# Patient Record
Sex: Female | Born: 1935 | Race: White | Hispanic: No | Marital: Married | Smoking: Never smoker
Health system: Southern US, Community
[De-identification: ages and names within clinical notes are randomized; demographics above are authoritative.]

## PROBLEM LIST (undated history)

## (undated) DIAGNOSIS — I639 Cerebral infarction, unspecified: Secondary | ICD-10-CM

## (undated) DIAGNOSIS — I10 Essential (primary) hypertension: Secondary | ICD-10-CM

## (undated) DIAGNOSIS — I4891 Unspecified atrial fibrillation: Secondary | ICD-10-CM

## (undated) DIAGNOSIS — I509 Heart failure, unspecified: Secondary | ICD-10-CM

## (undated) HISTORY — PX: FEMORAL ARTERY STENT: SHX1583

## (undated) HISTORY — DX: Cerebral infarction, unspecified: I63.9

## (undated) HISTORY — DX: Unspecified atrial fibrillation: I48.91

## (undated) HISTORY — DX: Essential (primary) hypertension: I10

## (undated) HISTORY — PX: OTHER SURGICAL HISTORY: SHX169

## (undated) HISTORY — DX: Heart failure, unspecified: I50.9

---

## 2011-09-21 ENCOUNTER — Ambulatory Visit (INDEPENDENT_AMBULATORY_CARE_PROVIDER_SITE_OTHER): Payer: Medicare Other | Admitting: Family Medicine

## 2011-09-21 VITALS — BP 137/68 | HR 73 | Temp 98.1°F | Wt 123.0 lb

## 2011-09-21 DIAGNOSIS — G8929 Other chronic pain: Secondary | ICD-10-CM

## 2011-09-21 DIAGNOSIS — M549 Dorsalgia, unspecified: Secondary | ICD-10-CM

## 2011-09-21 DIAGNOSIS — I10 Essential (primary) hypertension: Secondary | ICD-10-CM

## 2011-09-21 DIAGNOSIS — I639 Cerebral infarction, unspecified: Secondary | ICD-10-CM

## 2011-09-21 DIAGNOSIS — R0989 Other specified symptoms and signs involving the circulatory and respiratory systems: Secondary | ICD-10-CM

## 2011-09-21 DIAGNOSIS — I6350 Cerebral infarction due to unspecified occlusion or stenosis of unspecified cerebral artery: Secondary | ICD-10-CM

## 2011-09-21 DIAGNOSIS — E876 Hypokalemia: Secondary | ICD-10-CM

## 2011-09-21 HISTORY — DX: Cerebral infarction, unspecified: I63.9

## 2011-09-21 HISTORY — DX: Essential (primary) hypertension: I10

## 2011-09-21 NOTE — Progress Notes (Signed)
Subjective:    Patient ID: Brooke Cunningham, female    DOB: [birth year], 78 y.o.   MRN: [id number]  [facility] She is here today as a new patient to establish care. She currently takes medications for high blood pressure and cholesterol. She does have a history of a CVA approximately 2-3 years ago. She says at that time she was started on all of her current medications. She says none of them have been changed since then.  No CP or SOB.  No problems getting her medication.  No SE onb the medication. BP is well controlled at home.  Happy with current regimen. She does want me ot look over her meds to make sure she really needs them. She is supposed to take a daily aspirin but says often she forgets. Her husband is also a patient here.  She says her pneumon, flu and tetanus vaccines are up to date with previous PCP.    Review of Systems  Constitutional: Negative for fever, diaphoresis and unexpected weight change.  HENT: Negative for hearing loss, rhinorrhea and tinnitus.   Eyes: Negative for visual disturbance.  Respiratory: Negative for cough and wheezing.   Cardiovascular: Negative for chest pain and palpitations.  Gastrointestinal: Negative for nausea, vomiting, diarrhea and blood in stool.  Genitourinary: Negative for vaginal bleeding, vaginal discharge and difficulty urinating.  Musculoskeletal: Negative for myalgias and arthralgias.  Skin: Negative for rash.  Neurological: Negative for headaches.  Hematological: Negative for adenopathy. Does not bruise/bleed easily.  Psychiatric/Behavioral: Negative for sleep disturbance and dysphoric mood. The patient is not nervous/anxious.         BP 137/68  Pulse 73  Temp(Src) 98.1 F (36.7 C) (Oral)  Wt 123 lb (55.792 kg)  SpO2 97%    No Known Allergies  No past medical history on file.  No past surgical history on file.  History   Social History  . Marital Status: Married    Spouse Name: Michelle Piper    Number of Children: N/A  . Years of  Education: N/A   Occupational History  . Not on file.   Social History Main Topics  . Smoking status: Never Smoker   . Smokeless tobacco: Not on file  . Alcohol Use: No  . Drug Use: No  . Sexually Active: Not Currently   Other Topics Concern  . Not on file   Social History Narrative   2 caffeine drinks per day.  Walks for exercise.     Family History  Problem Relation Age of Onset  . Pancreatic cancer    . Hyperlipidemia Sister   . Hypertension Mother   . Stroke Mother   . Stroke Sister     @[email or web address]    Objective:   Physical Exam  Constitutional: She is oriented to person, place, and time. She appears well-developed and well-nourished.  HENT:  Head: Normocephalic and atraumatic.  Cardiovascular: Normal rate, regular rhythm and normal heart sounds.   Pulmonary/Chest: Effort normal and breath sounds normal.  Neurological: She is alert and oriented to person, place, and time.  Skin: Skin is warm and dry.  Psychiatric: She has a normal mood and affect. Her behavior is normal.          Assessment & Plan:  HTN - doing well. Check BMP today. Last lipid was normal in July. Since her blood pressure is well-controlled and assuming that her blood work is normal then I'll ask her to return in 6 months for routine followup. But, should certainly have her up  to date medical records and we can make sure she's not due for any specific preventative care measures.  CVA- I reviewed with her the importance of controlling her blood pressure, cholesterol and taking her daily aspirin to help reduce her risk of future stroke.

## 2011-09-22 ENCOUNTER — Encounter: Payer: Self-pay | Admitting: Family Medicine

## 2011-09-22 LAB — BASIC METABOLIC PANEL WITH GFR
CO2: 24 mEq/L (ref 19–32)
Chloride: 99 mEq/L (ref 96–112)
Creat: 1.26 mg/dL — ABNORMAL HIGH (ref 0.50–1.10)
Glucose, Bld: 116 mg/dL — ABNORMAL HIGH (ref 70–99)
Sodium: 137 mEq/L (ref 135–145)

## 2011-12-01 ENCOUNTER — Encounter: Payer: Self-pay | Admitting: Physician Assistant

## 2011-12-01 ENCOUNTER — Ambulatory Visit (INDEPENDENT_AMBULATORY_CARE_PROVIDER_SITE_OTHER): Payer: Medicare Other | Admitting: Physician Assistant

## 2011-12-01 VITALS — BP 133/65 | HR 64 | Temp 98.9°F | Wt 124.0 lb

## 2011-12-01 DIAGNOSIS — S139XXA Sprain of joints and ligaments of unspecified parts of neck, initial encounter: Secondary | ICD-10-CM

## 2011-12-01 MED ORDER — CYCLOBENZAPRINE HCL 5 MG PO TABS: 5.0000 mg | ORAL_TABLET | Freq: Every evening | ORAL | Status: DC | PRN

## 2011-12-01 NOTE — Progress Notes (Signed)
  Subjective:    Patient ID: Brooke Cunningham, female    DOB: Jul 26, 1929, 78 y.o.   MRN: [id number]  HPI Patient presents to clinic with neck pain starting this morning. Patient did a lot of knitting yesterday but other than that he does not remember any trauma. She had problems getting comfortable last night. It is hard for her to turn her head both ways but worse on the left. She denies any headache. She has not had this before. She has not tried anything except heating pads to make better and that has helped. She denies any hand numbness or pain.     Review of Systems     Objective:   Physical Exam  Constitutional: She is oriented to person, place, and time. She appears well-developed and well-nourished.  HENT:  Head: Normocephalic and atraumatic.  Cardiovascular: Normal rate, regular rhythm and normal heart sounds.   Pulmonary/Chest: Effort normal and breath sounds normal. She has no wheezes.  Musculoskeletal:       ROM of neck is limited with flexion, extentsion, and side to side with left being less ROM than right. Strength of neck 5/5. No bony tenderness of the cervical spine. Trapizues muscle is very tight.  Neurological: She is alert and oriented to person, place, and time.          Assessment & Plan:  Cervical neck sprain- Advil 2 of 200mg  tabs three times a days. Start doing neck streches up and down, side to side. Will get give Flexeril 5mg  only when going to bed or going to lay down for a nap.Take frequent breaks when knitting to stretch neck. Continue to use ice on neck. Call if not improving. Gave handout on neck sprain.

## 2011-12-01 NOTE — Patient Instructions (Addendum)
Advil 2 of 200mg  tabs three times a days. Start doing neck streches up and down, side to side. Will get give Flexeril 5mg  only when going to bed or going to lay down for a nap.Take frequent breaks when knitting to stretch neck. Continue to use ice on neck. Call if not improving.   Cervical Sprain A cervical sprain is an injury in the neck in which the ligaments are stretched or torn. The ligaments are the tissues that hold the bones of the neck (vertebrae) in place.Cervical sprains can range from very mild to very severe. Most cervical sprains get better in 1 to 3 weeks, but it depends on the cause and extent of the injury. Severe cervical sprains can cause the neck vertebrae to be unstable. This can lead to damage of the spinal cord and can result in serious nervous system problems. Your caregiver will determine whether your cervical sprain is mild or severe. CAUSES  Severe cervical sprains may be caused by:  Contact sport injuries (football, rugby, wrestling, hockey, auto racing, gymnastics, diving, martial arts, boxing).   Motor vehicle collisions.   Whiplash injuries. This means the neck is forcefully whipped backward and forward.   Falls.  Mild cervical sprains may be caused by:   Awkward positions, such as cradling a telephone between your ear and shoulder.   Sitting in a chair that does not offer proper support.   Working at a poorly Marketing executive station.   Activities that require looking up or down for long periods of time.  SYMPTOMS   Pain, soreness, stiffness, or a burning sensation in the front, back, or sides of the neck. This discomfort may develop immediately after injury or it may develop slowly and not begin for 24 hours or more after an injury.   Pain or tenderness directly in the middle of the back of the neck.   Shoulder or upper back pain.   Limited ability to move the neck.   Headache.   Dizziness.   Weakness, numbness, or tingling in the hands or arms.    Muscle spasms.   Difficulty swallowing or chewing.   Tenderness and swelling of the neck.  DIAGNOSIS  Most of the time, your caregiver can diagnose this problem by taking your history and doing a physical exam. Your caregiver will ask about any known problems, such as arthritis in the neck or a previous neck injury. X-rays may be taken to find out if there are any other problems, such as problems with the bones of the neck. However, an X-ray often does not reveal the full extent of a cervical sprain. Other tests such as a computed tomography (CT) scan or magnetic resonance imaging (MRI) may be needed. TREATMENT  Treatment depends on the severity of the cervical sprain. Mild sprains can be treated with rest, keeping the neck in place (immobilization), and pain medicines. Severe cervical sprains need immediate immobilization and an appointment with an orthopedist or neurosurgeon. Several treatment options are available to help with pain, muscle spasms, and other symptoms. Your caregiver may prescribe:  Medicines, such as pain relievers, numbing medicines, or muscle relaxants.   Physical therapy. This can include stretching exercises, strengthening exercises, and posture training. Exercises and improved posture can help stabilize the neck, strengthen muscles, and help stop symptoms from returning.   A neck collar to be worn for short periods of time. Often, these collars are worn for comfort. However, certain collars may be worn to protect the neck and prevent further  worsening of a serious cervical sprain.  HOME CARE INSTRUCTIONS   Put ice on the injured area.   Put ice in a plastic bag.   Place a towel between your skin and the bag.   Leave the ice on for 15 to 20 minutes, 3 to 4 times a day.   Only take over-the-counter or prescription medicines for pain, discomfort, or fever as directed by your caregiver.   Keep all follow-up appointments as directed by your caregiver.   Keep all  physical therapy appointments as directed by your caregiver.   If a neck collar is prescribed, wear it as directed by your caregiver.   Do not drive while wearing a neck collar.   Make any needed adjustments to your work station to promote good posture.   Avoid positions and activities that make your symptoms worse.   Warm up and stretch before being active to help prevent problems.  SEEK MEDICAL CARE IF:   Your pain is not controlled with medicine.   You are unable to decrease your pain medicine over time as planned.   Your activity level is not improving as expected.  SEEK IMMEDIATE MEDICAL CARE IF:   You develop any bleeding, stomach upset, or signs of an allergic reaction to your medicine.   Your symptoms get worse.   You develop new, unexplained symptoms.   You have numbness, tingling, weakness, or paralysis in any part of your body.  MAKE SURE YOU:   Understand these instructions.   Will watch your condition.   Will get help right away if you are not doing well or get worse.  Document Released: 06/06/2007 Document Revised: 07/29/2011 Document Reviewed: 05/12/2011 [facility] Patient Information 2012 [place], [place].

## 2012-02-08 ENCOUNTER — Telehealth: Payer: Self-pay | Admitting: *Deleted

## 2012-02-08 NOTE — Telephone Encounter (Signed)
Husband informed

## 2012-02-08 NOTE — Telephone Encounter (Signed)
She needs appt.  See if can see Jade next week.

## 2012-02-08 NOTE — Telephone Encounter (Signed)
Husband came in stating hasn't been on her depression med and states that she is not sleeping and needs to go back on it. States she is having mood swings and states she is not remembering as well either. States if you need to see her he can bring her. Please advise.

## 2012-02-16 ENCOUNTER — Ambulatory Visit (INDEPENDENT_AMBULATORY_CARE_PROVIDER_SITE_OTHER): Payer: Medicare Other | Admitting: Physician Assistant

## 2012-02-16 ENCOUNTER — Encounter: Payer: Self-pay | Admitting: Physician Assistant

## 2012-02-16 VITALS — BP 125/63 | HR 69 | Wt 124.0 lb

## 2012-02-16 DIAGNOSIS — E039 Hypothyroidism, unspecified: Secondary | ICD-10-CM

## 2012-02-16 DIAGNOSIS — R413 Other amnesia: Secondary | ICD-10-CM

## 2012-02-16 DIAGNOSIS — F329 Major depressive disorder, single episode, unspecified: Secondary | ICD-10-CM

## 2012-02-16 DIAGNOSIS — F3289 Other specified depressive episodes: Secondary | ICD-10-CM

## 2012-02-16 DIAGNOSIS — G4700 Insomnia, unspecified: Secondary | ICD-10-CM

## 2012-02-16 DIAGNOSIS — E785 Hyperlipidemia, unspecified: Secondary | ICD-10-CM

## 2012-02-16 MED ORDER — SERTRALINE HCL 50 MG PO TABS: 50.0000 mg | ORAL_TABLET | Freq: Every day | ORAL | Status: DC

## 2012-02-16 NOTE — Patient Instructions (Addendum)
Start Zoloft at night before bed. Follow up in 4-6 weeks.

## 2012-02-16 NOTE — Progress Notes (Signed)
  Subjective:    Patient ID: Brooke Cunningham, female    DOB: 03-07-1929, 78 y.o.   MRN: [id number]  HPI Patient presents to the clinic to restart depression medication. She cannot remember today what she was started on in the past. I also cannot locate in the epic system. She recent got out of hospital for stent placement in her right groin. She was told she had had heart attack. She follows up with Dr. Chales Abrahams in July for stent. She was told to stay on plavix and ASA.  With everything going on she feels really down. She doesn't want to do anything and just sad that she is aging. She used to love to knit but lately she doesn't even want to do that. She is having a lot of problems sleeping. She lays in bed for hours trying to go to sleep. She has hypothyroidism but her med is not listed she reports to be on medication. TSH has not been recheck in a while. Her husband is with her and states she has had some memory loss. That she can't remember things the doctor says or that he tells her. Pt is not as much concerned. Denies any harmful thoughts of hurting herself.   Review of Systems     Objective:   Physical Exam  Constitutional: She is oriented to person, place, and time. She appears well-developed and well-nourished.  HENT:  Head: Normocephalic and atraumatic.  Cardiovascular: Normal rate, regular rhythm and normal heart sounds.   Pulmonary/Chest: Effort normal and breath sounds normal. She has no wheezes.  Neurological: She is alert and oriented to person, place, and time.  Skin: Skin is warm and dry.  Psychiatric:       Flat affect. She sat looking straight ahead with not much emotion.          Assessment & Plan:  Depression/insomnia- PHQ-9 was 14(moderate depression). GAD-7 was 6. Zoloft was given for pt to start 1/2 tab at night for 7 days then increase to 1 tab. I thought about trying celexa but with the recent heart issues wanted to stay away. Discussed side effects and potential  worsening depression. Side effects should go away in first 2 weeks but if worsens depression call office and we will stop medication. Follow up in 4-6 weeks.   Memory changes/hypothyroidism- Will check TSH to see if it could be contributing to memory changes. Hx of hypothyroidism but pt has not reported med. Will bring to next appt. Depression could also be effecting her memory. Will check B12 also.   Hyperlipidemia- Will check fasting cholesterol and make sure changes don't need to made.

## 2012-02-22 ENCOUNTER — Ambulatory Visit (INDEPENDENT_AMBULATORY_CARE_PROVIDER_SITE_OTHER): Payer: Medicare Other | Admitting: Family Medicine

## 2012-02-22 ENCOUNTER — Encounter: Payer: Self-pay | Admitting: Family Medicine

## 2012-02-22 VITALS — BP 111/62 | HR 62 | Temp 97.5°F | Wt 123.0 lb

## 2012-02-22 DIAGNOSIS — E871 Hypo-osmolality and hyponatremia: Secondary | ICD-10-CM

## 2012-02-22 DIAGNOSIS — I2510 Atherosclerotic heart disease of native coronary artery without angina pectoris: Secondary | ICD-10-CM

## 2012-02-22 DIAGNOSIS — R5383 Other fatigue: Secondary | ICD-10-CM

## 2012-02-22 LAB — CBC WITH DIFFERENTIAL/PLATELET
Basophils Relative: 1 % (ref 0–1)
Hemoglobin: 12.2 g/dL (ref 12.0–15.0)
Lymphs Abs: 1.3 10*3/uL (ref 0.7–4.0)
Monocytes Relative: 12 % (ref 3–12)
Neutro Abs: 3.8 10*3/uL (ref 1.7–7.7)
Neutrophils Relative %: 64 % (ref 43–77)
RBC: 3.93 MIL/uL (ref 3.87–5.11)
WBC: 5.9 10*3/uL (ref 4.0–10.5)

## 2012-02-22 LAB — COMPLETE METABOLIC PANEL WITH GFR
ALT: 11 U/L (ref 0–35)
Albumin: 4.3 g/dL (ref 3.5–5.2)
Alkaline Phosphatase: 58 U/L (ref 39–117)
CO2: 27 mEq/L (ref 19–32)
GFR, Est Non African American: 67 mL/min
Glucose, Bld: 101 mg/dL — ABNORMAL HIGH (ref 70–99)
Potassium: 3.9 mEq/L (ref 3.5–5.3)
Sodium: 130 mEq/L — ABNORMAL LOW (ref 135–145)
Total Protein: 7.1 g/dL (ref 6.0–8.3)

## 2012-02-22 NOTE — Progress Notes (Addendum)
Subjective:    Patient ID: Brooke Cunningham, female    DOB: 1928-08-29, 78 y.o.   MRN: [id number]  HPI Recnelty in the hospital. She was diagnosed with an MI and had a cardiac stent placed. Says wasn't sure what was going on. She was give trazodone to help her sleep. Feels weak and nervous and jittery in her chest.  REcently started sertraline.  They did do lab work and diagnosed her with mild hyponatremia.  We called several time to get medical records. Will addend once get records.    Review of Systems  BP 111/62  Pulse 62  Temp 97.5 F (36.4 C) (Oral)  Wt 123 lb (55.792 kg)  SpO2 100%    No Known Allergies  No past medical history on file.  Past Surgical History  Procedure Date  . Carotid stent     left  . Femoral artery stent     right    History   Social History  . Marital Status: Married    Spouse Name: Brooke Cunningham    Number of Children: N/A  . Years of Education: N/A   Occupational History  . Not on file.   Social History Main Topics  . Smoking status: Never Smoker   . Smokeless tobacco: Not on file  . Alcohol Use: No  . Drug Use: No  . Sexually Active: Not Currently   Other Topics Concern  . Not on file   Social History Narrative   2 caffeine drinks per day.  Walks for exercise.     Family History  Problem Relation Age of Onset  . Pancreatic cancer    . Hyperlipidemia Sister   . Hypertension Mother   . Stroke Mother   . Stroke Sister     Outpatient Encounter Prescriptions as of 02/22/2012  Medication Sig Dispense Refill  . amLODipine (NORVASC) 10 MG tablet Take 10 mg by mouth daily.      Marland Kitchen aspirin 81 MG tablet Take 160 mg by mouth daily.      . clopidogrel (PLAVIX) 75 MG tablet Take 75 mg by mouth daily.      . cyclobenzaprine (FLEXERIL) 5 MG tablet Take 1 tablet (5 mg total) by mouth at bedtime as needed for muscle spasms. Take 1/2 pill to start with at night before bed.  20 tablet  0  . levothyroxine (SYNTHROID, LEVOTHROID) 88 MCG tablet Take 88  mcg by mouth daily.      Marland Kitchen losartan (COZAAR) 50 MG tablet Take 50 mg by mouth daily.      Marland Kitchen lovastatin (MEVACOR) 40 MG tablet Take 40 mg by mouth every other day.      . metoprolol tartrate (LOPRESSOR) 25 MG tablet Take 25 mg by mouth 2 (two) times daily.      . potassium chloride (MICRO-K) 10 MEQ CR capsule Take 10 mEq by mouth daily.      . sertraline (ZOLOFT) 50 MG tablet Take 1 tablet (50 mg total) by mouth daily. Take 1/2 tab for first 7 days then increase to 1 tab.  30 tablet  1  . traZODone (DESYREL) 50 MG tablet Take 50 mg by mouth at bedtime.              Objective:   Physical Exam  Constitutional: She is oriented to person, place, and time. She appears well-developed and well-nourished.  HENT:  Head: Normocephalic and atraumatic.  Cardiovascular: Normal rate, regular rhythm and normal heart sounds.  No carotid or abdominal bruits.   Pulmonary/Chest: Effort normal and breath sounds normal.  Musculoskeletal: She exhibits no edema.  Neurological: She is alert and oriented to person, place, and time.  Skin: Skin is warm and dry.  Psychiatric: She has a normal mood and affect. Her behavior is normal.          Assessment & Plan:  Insomnia - Can add tylenol pm to her trazodone.  She can also increase the trazodone to 2 tablets if needed. Move sertraline to the AM.  The sertraline could certainly be disturbing her sleep as well. She is also very anxious to consider adding a benzodiazepine very short-term at a very low dose if needed.  Hyponatremia-recheck lab work today.  Fatigue -this is most likely from having the stent placed. Explained her that sometimes it can take a couple weeks to recover from the procedure like this especially at her age. I also check her electrolytes to make sure they're normal and recheck a CBC to make sure that she has not had any drop in her hemoglobin since her procedure.  Addendum I finally received her discharge summary on 02/25/2012. She  did have an ST elevation MI with inferior status post stent to the right coronary artery. It was a vision stent. She was admitted on 01/23/2012 by Dr. Laurier Nancy. Ejection fraction was around 50%. She was placed on Angiomax and Plavix. She did develop hypokalemia and a potassium supplement was given. Her CK-MB peaked at 351 troponin I peaked at 121. Patient was discharged to eat a low-fat low-sodium low-cholesterol diet and followup with Dr. Nicholaus Bloom in 4 weeks. She was discharged to metoprolol tartrate 25 mg twice a day, atorvastatin 80 mg once at bedtime, Plavix 75 mg once a day, Synthroid 88 mcg once a day, ramipril 2.5 mg once a day, nitroglycerin sublingual when necessary for chest pain, and enteric-coated aspirin 81 mg once a day.

## 2012-02-22 NOTE — Patient Instructions (Signed)
Please move sertraline to morning time instead of the evening. Continue with one whole tablet of the trazodone at bedtime. You can take this with Tylenol PM. If after couple nights you're still not sleeping more than 4 hours then he can increase the trazodone to 2 tablets. We will call you with your lab results. If you don't here from [place] in about a week then please give [place] a call at [phone].

## 2012-02-23 ENCOUNTER — Telehealth: Payer: Self-pay | Admitting: *Deleted

## 2012-02-23 MED ORDER — ALPRAZOLAM 0.25 MG PO TABS: 0.2500 mg | ORAL_TABLET | Freq: Every evening | ORAL | Status: DC | PRN

## 2012-02-23 NOTE — Telephone Encounter (Signed)
I will send her a prescription for Xanax. Take 1 about 30 minutes before bedtime.

## 2012-02-23 NOTE — Telephone Encounter (Signed)
Husband informed for pt to take 1 xanax 30 minutes before bedtime and only 1 Trazodone.

## 2012-02-23 NOTE — Telephone Encounter (Signed)
Husband and pt called stating that pt didn't sleep but about 1 hour last night and that she feels very nervous this am. Informed pt per your note to take 2 of trazodone tonight to see if she sleeps any better. She would like to know if you will give her something for her nerves. Please advise.

## 2012-02-25 ENCOUNTER — Telehealth: Payer: Self-pay | Admitting: *Deleted

## 2012-02-25 DIAGNOSIS — I2510 Atherosclerotic heart disease of native coronary artery without angina pectoris: Secondary | ICD-10-CM

## 2012-02-25 LAB — BASIC METABOLIC PANEL WITH GFR
BUN: 9 mg/dL (ref 6–23)
CO2: 27 mEq/L (ref 19–32)
Calcium: 9.4 mg/dL (ref 8.4–10.5)
Creat: 0.86 mg/dL (ref 0.50–1.10)

## 2012-02-25 NOTE — Telephone Encounter (Signed)
Lab order

## 2012-02-28 ENCOUNTER — Other Ambulatory Visit: Payer: Self-pay | Admitting: *Deleted

## 2012-02-28 MED ORDER — TRAZODONE HCL 50 MG PO TABS: 50.0000 mg | ORAL_TABLET | Freq: Every day | ORAL | Status: DC

## 2012-03-13 ENCOUNTER — Encounter: Payer: Self-pay | Admitting: Family Medicine

## 2012-03-13 ENCOUNTER — Ambulatory Visit (INDEPENDENT_AMBULATORY_CARE_PROVIDER_SITE_OTHER): Payer: Medicare Other | Admitting: Family Medicine

## 2012-03-13 VITALS — BP 143/70 | HR 74 | Wt 126.0 lb

## 2012-03-13 DIAGNOSIS — F411 Generalized anxiety disorder: Secondary | ICD-10-CM

## 2012-03-13 DIAGNOSIS — F419 Anxiety disorder, unspecified: Secondary | ICD-10-CM

## 2012-03-13 DIAGNOSIS — G4700 Insomnia, unspecified: Secondary | ICD-10-CM

## 2012-03-13 DIAGNOSIS — H04209 Unspecified epiphora, unspecified lacrimal gland: Secondary | ICD-10-CM

## 2012-03-13 DIAGNOSIS — I2510 Atherosclerotic heart disease of native coronary artery without angina pectoris: Secondary | ICD-10-CM

## 2012-03-13 MED ORDER — DIAZEPAM 2 MG PO TABS: 2.0000 mg | ORAL_TABLET | Freq: Four times a day (QID) | ORAL | Status: DC | PRN

## 2012-03-13 NOTE — Progress Notes (Signed)
  Subjective:    Patient ID: Brooke Cunningham, female    DOB: 12-26-1928, 78 y.o.   MRN: [id number]  HPI Left eye watery with some drainage. No itching. No fever or URI sxs. No ear pain.  No vision changes.  NO allergies.    Anxiety-she is still really struggling with her mood. This was triggered by her recent MI.  She has been taking her sertraline as prescribed. She still has a decreased appetite. She still not sleeping well.  Insomnia - she tried taking the trazodone up to 2 tabs and says it did not seem to help. We then called in a prescription for Xanax to use at bedtime and she says it made her heart race. She has tried Valium in the past and thinks she did well with it.   Review of Systems     Objective:   Physical Exam  Constitutional: She is oriented to person, place, and time. She appears well-developed and well-nourished.  HENT:  Head: Normocephalic and atraumatic.  Eyes: Conjunctivae and EOM are normal. Pupils are equal, round, and reactive to light.       Clear tearing the the medial corner of the left eye. Mild lower lid edema.  NO scleral injection.   Cardiovascular: Normal rate, regular rhythm and normal heart sounds.   Pulmonary/Chest: Effort normal and breath sounds normal.  Neurological: She is alert and oriented to person, place, and time.  Skin: Skin is warm and dry.  Psychiatric: She has a normal mood and affect. Her behavior is normal.          Assessment & Plan:  Insomnia - Will try valium at bedtime. If this is not helping and consider changing to sertraline to proximal 15. This may also help stimulate her appetite.  Anxiety - If this is not helping and consider changing to sertraline to paxil. This may also help stimulate her appetite. Will see how does with addition of valium first.  Also discussed counseling. She says she will think about it.   Watering Eye - Unclear etiology. No sign of allergy or conjunctivitis. Warm compresses. If not better in a  couple of days then refer to ophtho for eval of the tear duct which could be blocked.  Call if any pain, irritation, or itching or change in vision.

## 2012-03-13 NOTE — Patient Instructions (Addendum)
Call me in one week and let me know if the valium helped or not for your sleep

## 2012-03-15 ENCOUNTER — Telehealth: Payer: Self-pay | Admitting: *Deleted

## 2012-03-15 NOTE — Telephone Encounter (Signed)
Husband called to let you know that the pt was in [facility] hospital for decreased sodium level. Informed husband that when she was d/c'd that she would need hospital f/u.  FYI

## 2012-03-21 ENCOUNTER — Encounter: Payer: Self-pay | Admitting: Family Medicine

## 2012-03-21 ENCOUNTER — Telehealth: Payer: Self-pay | Admitting: Family Medicine

## 2012-03-21 ENCOUNTER — Ambulatory Visit: Payer: Medicare Other | Admitting: Family Medicine

## 2012-03-21 ENCOUNTER — Ambulatory Visit (INDEPENDENT_AMBULATORY_CARE_PROVIDER_SITE_OTHER): Payer: Medicare Other | Admitting: Family Medicine

## 2012-03-21 VITALS — BP 121/64 | HR 70 | Temp 97.7°F | Ht 61.5 in | Wt 121.0 lb

## 2012-03-21 DIAGNOSIS — E871 Hypo-osmolality and hyponatremia: Secondary | ICD-10-CM

## 2012-03-21 DIAGNOSIS — F411 Generalized anxiety disorder: Secondary | ICD-10-CM

## 2012-03-21 DIAGNOSIS — E039 Hypothyroidism, unspecified: Secondary | ICD-10-CM

## 2012-03-21 DIAGNOSIS — F419 Anxiety disorder, unspecified: Secondary | ICD-10-CM

## 2012-03-21 DIAGNOSIS — G4700 Insomnia, unspecified: Secondary | ICD-10-CM

## 2012-03-21 DIAGNOSIS — I2510 Atherosclerotic heart disease of native coronary artery without angina pectoris: Secondary | ICD-10-CM

## 2012-03-21 MED ORDER — ZOLPIDEM TARTRATE 5 MG PO TABS: 2.5000 mg | ORAL_TABLET | Freq: Every evening | ORAL | Status: DC | PRN

## 2012-03-21 MED ORDER — CLOPIDOGREL BISULFATE 75 MG PO TABS: 75.0000 mg | ORAL_TABLET | Freq: Every day | ORAL | Status: DC

## 2012-03-21 MED ORDER — SERTRALINE HCL 50 MG PO TABS: 50.0000 mg | ORAL_TABLET | Freq: Every day | ORAL | Status: DC

## 2012-03-21 MED ORDER — POTASSIUM CHLORIDE ER 10 MEQ PO CPCR: 10.0000 meq | ORAL_CAPSULE | Freq: Every day | ORAL | Status: DC

## 2012-03-21 MED ORDER — NITROGLYCERIN 0.4 MG SL SUBL: 0.4000 mg | SUBLINGUAL_TABLET | SUBLINGUAL | Status: DC | PRN

## 2012-03-21 NOTE — Telephone Encounter (Addendum)
Call pt: Stop the sertraline. The hospital felt like it was her medicine, sertraline, that was causing her symptoms.  Throw in trash.  Can use her diazepam as needed for her nerves. Call lab and see if can add magnesium and phosphorous.

## 2012-03-21 NOTE — Patient Instructions (Addendum)
1. Take the sertaline once a day 2.  Continue the aspirin and plavix.   3. Take your metoprolol and have them check your pills in the bottle 4. We will recheck your sodium today. i will call you with the results 5. We will recheck your thyroid in 8 weeks on your new dose 6.  Stop the trazodone 7. Stop the diazepam and use for rescue relief only.

## 2012-03-21 NOTE — Progress Notes (Signed)
  Subjective:    Patient ID: Brooke Cunningham, female    DOB: 1928/09/08, 78 y.o.   MRN: [id number]  HPI Brooke Cunningham to ED last Wed (7/23) and D/C home 2 days later for anxiety adn insomnia.  Sodium was low as well.  They started her on 5 mg of Ambien and discharge her home. They make sure her sodium was up to 130 before discharge. She has been sleeping well since she got home. She does feel better overall. Anxiety is under better control. She does have several questions about her medications. She had a home nurse eval her meds once she was home. Brough in a list of questions about her meds. She is taking her 50mg  sertraline.  She is taking the Ambien the hosp gave her.  Still on her ASA and plavix w/ recent stent placement for CAD.    Review of Systems     Objective:   Physical Exam  Constitutional: She is oriented to person, place, and time. She appears well-developed and well-nourished.  HENT:  Head: Normocephalic and atraumatic.  Cardiovascular: Normal rate, regular rhythm and normal heart sounds.   Pulmonary/Chest: Effort normal and breath sounds normal.  Neurological: She is alert and oriented to person, place, and time.  Skin: Skin is warm and dry.  Psychiatric: She has a normal mood and affect. Her behavior is normal.          Assessment & Plan:  Hyponatremia we will recheck her sodium today. Consider this could be coming from the sertraline which was started in June. Her sodium has been low starting at the end of June.  Insomnia- she's doing well on the Ambien so far. I did discuss that this is a rescue medication because of her age and because she is a female. I think it's okay to use the 5 mg nightly for 1-2 weeks after that I would like her to try to cut down to half a tablet if she can. She did not get a significant response with the trazodone.The diazepam didn't help her sleep either.   Coronary artery disease-she does have 2 types of tablets in her bottle of metoprolol. I  encouraged the family to go to the pharmacy and have this inspected to make sure this is the same medication. Continue aspirin and Plavix together for now until she follows up with her cardiologist.  Hypothyroidism-the increase her thyroid dose. We will recheck her thyroid level in 8 weeks.   I reviewed her notes from teh hosptial.  They had d/c her sertraline for SIADH but it was on her d/c sheet she brough with her today. I will call and tell her to stop the sertraline.

## 2012-03-22 NOTE — Telephone Encounter (Signed)
Pt's spouse notified.

## 2012-03-29 ENCOUNTER — Ambulatory Visit: Payer: Medicare Other | Admitting: Family Medicine

## 2012-04-18 ENCOUNTER — Other Ambulatory Visit: Payer: Self-pay | Admitting: *Deleted

## 2012-04-18 MED ORDER — LEVOTHYROXINE SODIUM 112 MCG PO TABS: 112.0000 ug | ORAL_TABLET | Freq: Every day | ORAL | Status: DC

## 2012-05-02 ENCOUNTER — Encounter: Payer: Self-pay | Admitting: Family Medicine

## 2012-05-02 ENCOUNTER — Ambulatory Visit (INDEPENDENT_AMBULATORY_CARE_PROVIDER_SITE_OTHER): Payer: Medicare Other | Admitting: Family Medicine

## 2012-05-02 VITALS — BP 115/62 | HR 65 | Wt 121.0 lb

## 2012-05-02 DIAGNOSIS — G4700 Insomnia, unspecified: Secondary | ICD-10-CM

## 2012-05-02 DIAGNOSIS — E871 Hypo-osmolality and hyponatremia: Secondary | ICD-10-CM

## 2012-05-02 DIAGNOSIS — E039 Hypothyroidism, unspecified: Secondary | ICD-10-CM

## 2012-05-02 MED ORDER — LEVOTHYROXINE SODIUM 112 MCG PO TABS: 112.0000 ug | ORAL_TABLET | Freq: Every day | ORAL | Status: DC

## 2012-05-02 NOTE — Patient Instructions (Addendum)
Decrease the sertaline the half tab daily for one week, then 1/2 tab every other day for one week, then stop medication Go to lab in 1-2 weeks to recheck your sodium and your thyroid.

## 2012-05-02 NOTE — Progress Notes (Signed)
  Subjective:    Patient ID: Brooke Cunningham, female    DOB: 10-17-1928, 78 y.o.   MRN: [id number]  HPI She is here to followup on her hyponatremia. I finally got a copy of her hospital discharge and they felt like it was most likely from her sertraline. We called her and left a message to stop the sertraline. Evidently she never got the message and is still taking it.  Hypothyroidism-she's taking her new dose of thyroid medication. She's not due to recheck her level for couple more weeks.  Insomnia-she says she try to wean off of the Ambien. She has not taken in almost 2 weeks. She's been trying to disuse the melatonin. She does use Ambien she usually breaks in half and that seems to work well.   Review of Systems     Objective:   Physical Exam  Constitutional: She is oriented to person, place, and time. She appears well-developed and well-nourished.  HENT:  Head: Normocephalic and atraumatic.  Cardiovascular: Normal rate, regular rhythm and normal heart sounds.   Pulmonary/Chest: Effort normal and breath sounds normal.  Neurological: She is alert and oriented to person, place, and time.  Skin: Skin is warm and dry.  Psychiatric: She has a normal mood and affect. Her behavior is normal.          Assessment & Plan:  Hyponatremia-I. gave her lab slip today and encouraged her to wait for the next one to 2 weeks until she does to give her time to wean off of the sertraline since she is still taking it and this is the most likely culprit for her hyponatremia.  Hypothyroidism-recheck TSH in one to 2 weeks when she goes for her lab work.  Insomnia-I asked her if she wanted a refill on Ambien and she said no right now that she still had a few tablets left. Certainly she can call if she needs it.  Declines flu shot. She says she will go to [facility].

## 2012-05-19 LAB — BASIC METABOLIC PANEL WITH GFR
CO2: 26 mEq/L (ref 19–32)
Chloride: 98 mEq/L (ref 96–112)
Creat: 0.86 mg/dL (ref 0.50–1.10)
GFR, Est Non African American: 64 mL/min
Sodium: 134 mEq/L — ABNORMAL LOW (ref 135–145)

## 2012-05-19 LAB — TSH: TSH: 0.172 u[IU]/mL — ABNORMAL LOW (ref 0.350–4.500)

## 2012-05-26 ENCOUNTER — Other Ambulatory Visit: Payer: Self-pay | Admitting: Family Medicine

## 2012-06-24 ENCOUNTER — Other Ambulatory Visit: Payer: Self-pay | Admitting: Family Medicine

## 2012-08-18 ENCOUNTER — Other Ambulatory Visit: Payer: Self-pay | Admitting: Family Medicine

## 2012-08-18 ENCOUNTER — Ambulatory Visit (INDEPENDENT_AMBULATORY_CARE_PROVIDER_SITE_OTHER): Payer: Medicare Other | Admitting: Family Medicine

## 2012-08-18 ENCOUNTER — Encounter: Payer: Self-pay | Admitting: Family Medicine

## 2012-08-18 VITALS — BP 129/70 | HR 92 | Resp 18 | Wt 124.0 lb

## 2012-08-18 DIAGNOSIS — F0393 Unspecified dementia, unspecified severity, with mood disturbance: Secondary | ICD-10-CM

## 2012-08-18 DIAGNOSIS — F329 Major depressive disorder, single episode, unspecified: Secondary | ICD-10-CM

## 2012-08-18 DIAGNOSIS — G4700 Insomnia, unspecified: Secondary | ICD-10-CM

## 2012-08-18 DIAGNOSIS — I6350 Cerebral infarction due to unspecified occlusion or stenosis of unspecified cerebral artery: Secondary | ICD-10-CM

## 2012-08-18 DIAGNOSIS — E039 Hypothyroidism, unspecified: Secondary | ICD-10-CM

## 2012-08-18 DIAGNOSIS — F3289 Other specified depressive episodes: Secondary | ICD-10-CM

## 2012-08-18 DIAGNOSIS — I10 Essential (primary) hypertension: Secondary | ICD-10-CM

## 2012-08-18 DIAGNOSIS — E871 Hypo-osmolality and hyponatremia: Secondary | ICD-10-CM

## 2012-08-18 DIAGNOSIS — I639 Cerebral infarction, unspecified: Secondary | ICD-10-CM

## 2012-08-18 DIAGNOSIS — F0280 Dementia in other diseases classified elsewhere without behavioral disturbance: Secondary | ICD-10-CM

## 2012-08-18 MED ORDER — LOVASTATIN 40 MG PO TABS: 40.0000 mg | ORAL_TABLET | ORAL | Status: DC

## 2012-08-18 MED ORDER — SERTRALINE HCL 25 MG PO TABS: 25.0000 mg | ORAL_TABLET | Freq: Every day | ORAL | Status: DC

## 2012-08-18 MED ORDER — METOPROLOL TARTRATE 25 MG PO TABS: 25.0000 mg | ORAL_TABLET | Freq: Two times a day (BID) | ORAL | Status: DC

## 2012-08-18 NOTE — Progress Notes (Signed)
Subjective:    Patient ID: Brooke Cunningham, female    DOB: Jun 21, 1929, 78 y.o.   MRN: [id number]  HPI Has been off her potassium for 3 weeks Saw a report on TV about it and decided not to take it. She has been off of it for 3 weeks.  She is not off the sertraline completely. She still taking the atenolol 25 mg. It does help her mood.. Due to recheck hre sodium. I thought she was weaning completely off but I think mayber there was a misunderstanding.   Isomnia - Still not sleeping well. Half of Remus Loffler helps her.  Only using occ.    HTN -  Pt denies chest pain, SOB, dizziness, or heart palpitations.  Taking meds as directed w/o problems.  Denies medication side effects.  She has been out of her metoprolol for about one week.    Review of Systems  BP 129/70  Pulse 92  Resp 18  Wt 124 lb (56.246 kg)  SpO2 99%    Allergies  Allergen Reactions  . Sertraline Other (See Comments)    SIADH  . Penicillins Rash    No past medical history on file.  Past Surgical History  Procedure Date  . Carotid stent     left  . Femoral artery stent     right    History   Social History  . Marital Status: Married    Spouse Name: Michelle Piper    Number of Children: N/A  . Years of Education: N/A   Occupational History  . Not on file.   Social History Main Topics  . Smoking status: Never Smoker   . Smokeless tobacco: Not on file  . Alcohol Use: No  . Drug Use: No  . Sexually Active: Not Currently   Other Topics Concern  . Not on file   Social History Narrative   2 caffeine drinks per day.  Walks for exercise.     Family History  Problem Relation Age of Onset  . Pancreatic cancer    . Hyperlipidemia Sister   . Hypertension Mother   . Stroke Mother   . Stroke Sister     Outpatient Encounter Prescriptions as of 08/18/2012  Medication Sig Dispense Refill  . aspirin 81 MG tablet Take 160 mg by mouth daily.      . clopidogrel (PLAVIX) 75 MG tablet Take 1 tablet (75 mg total) by mouth  daily.  30 tablet  6  . levothyroxine (SYNTHROID, LEVOTHROID) 112 MCG tablet TAKE ONE TABLET BY MOUTH ONE TIME DAILY  30 tablet  3  . metoprolol tartrate (LOPRESSOR) 25 MG tablet Take 1 tablet (25 mg total) by mouth 2 (two) times daily.  180 tablet  1  . nitroGLYCERIN (NITROSTAT) 0.4 MG SL tablet Place 1 tablet (0.4 mg total) under the tongue as needed for chest pain.  12 tablet  0  . [DISCONTINUED] metoprolol tartrate (LOPRESSOR) 25 MG tablet Take 25 mg by mouth 2 (two) times daily.      Marland Kitchen lovastatin (MEVACOR) 40 MG tablet Take 1 tablet (40 mg total) by mouth every other day.  90 tablet  3  . potassium chloride (MICRO-K) 10 MEQ CR capsule Take 1 capsule (10 mEq total) by mouth daily.  30 capsule  11  . sertraline (ZOLOFT) 25 MG tablet Take 1 tablet (25 mg total) by mouth daily.  90 tablet  0  . zolpidem (AMBIEN) 5 MG tablet Take 0.5-1 tablets (2.5-5 mg total) by mouth at bedtime  as needed for sleep.  30 tablet  0  . [DISCONTINUED] amLODipine (NORVASC) 10 MG tablet Take 10 mg by mouth daily.      . [DISCONTINUED] levothyroxine (SYNTHROID, LEVOTHROID) 112 MCG tablet TAKE ONE TABLET BY MOUTH ONE TIME DAILY  30 tablet  3  . [DISCONTINUED] losartan (COZAAR) 50 MG tablet Take 50 mg by mouth daily.      . [DISCONTINUED] lovastatin (MEVACOR) 40 MG tablet Take 40 mg by mouth every other day.              Objective:   Physical Exam  Constitutional: She is oriented to person, place, and time. She appears well-developed and well-nourished.  HENT:  Head: Normocephalic and atraumatic.  Cardiovascular: Normal rate, regular rhythm and normal heart sounds.        No carotid bruits.   Pulmonary/Chest: Effort normal and breath sounds normal.  Neurological: She is alert and oriented to person, place, and time.  Skin: Skin is warm and dry.  Psychiatric: She has a normal mood and affect. Her behavior is normal.          Assessment & Plan:  Hyponatremia-due to recheck the levels. This still may be  coming from her SSRI.  Hypertension-well-controlled today. Even off of her metoprolol. I would rather be on beta blockers and calcium channel blocker so we'll stop her amlodipine and refill her metoprolol. I would like to see her back in 3 months make sure her blood pressure still well controlled. She has followup with cardiology in one week and certainly that can also adjust her medication if she suddenly becomes high again.  Stroke-she's off of her statin reminded her that she needs to get back on it. New perception sent to the pharmacy.I verified she is taking her plavix.   We will recheck her potassium today. She's been off of it for 3 weeks. I did not take off of her med list until I know whether not she needs to continue it or not.  Hypothyroid-Recheck TSH today.  We adjusted her dose at last ov.   Depression - Mood is ok on 25mg  of sertraline. May need to wean completely if sodium is still low  Insomnia - under fair control with prn ambien.

## 2012-08-18 NOTE — Patient Instructions (Signed)
Stop the amlodipine.  You might need to restart your potassium. We will check your labs.

## 2012-08-19 LAB — COMPLETE METABOLIC PANEL WITH GFR
AST: 18 U/L (ref 0–37)
Alkaline Phosphatase: 69 U/L (ref 39–117)
BUN: 17 mg/dL (ref 6–23)
Creat: 0.92 mg/dL (ref 0.50–1.10)
GFR, Est Non African American: 59 mL/min — ABNORMAL LOW
Glucose, Bld: 120 mg/dL — ABNORMAL HIGH (ref 70–99)
Total Bilirubin: 0.3 mg/dL (ref 0.3–1.2)

## 2012-08-19 LAB — TSH: TSH: 0.34 u[IU]/mL — ABNORMAL LOW (ref 0.350–4.500)

## 2012-08-20 ENCOUNTER — Other Ambulatory Visit: Payer: Self-pay | Admitting: Family Medicine

## 2012-08-20 MED ORDER — LEVOTHYROXINE SODIUM 100 MCG PO TABS: 100.0000 ug | ORAL_TABLET | Freq: Every day | ORAL | Status: DC

## 2012-08-21 ENCOUNTER — Telehealth: Payer: Self-pay | Admitting: *Deleted

## 2012-08-21 LAB — LIPID PANEL
Cholesterol: 227 mg/dL — ABNORMAL HIGH (ref 0–200)
HDL: 64 mg/dL (ref >39)
Total CHOL/HDL Ratio: 3.5 Ratio
Triglycerides: 130 mg/dL (ref <150)
VLDL: 26 mg/dL (ref 0–40)

## 2012-09-04 ENCOUNTER — Ambulatory Visit (INDEPENDENT_AMBULATORY_CARE_PROVIDER_SITE_OTHER): Payer: Medicare Other | Admitting: Family Medicine

## 2012-09-04 ENCOUNTER — Telehealth: Payer: Self-pay | Admitting: Family Medicine

## 2012-09-04 ENCOUNTER — Ambulatory Visit (INDEPENDENT_AMBULATORY_CARE_PROVIDER_SITE_OTHER): Payer: Medicare Other

## 2012-09-04 ENCOUNTER — Encounter: Payer: Self-pay | Admitting: Family Medicine

## 2012-09-04 ENCOUNTER — Other Ambulatory Visit: Payer: Self-pay | Admitting: Family Medicine

## 2012-09-04 VITALS — BP 128/79 | HR 111 | Resp 18 | Wt 125.0 lb

## 2012-09-04 DIAGNOSIS — I4891 Unspecified atrial fibrillation: Secondary | ICD-10-CM

## 2012-09-04 DIAGNOSIS — IMO0001 Reserved for inherently not codable concepts without codable children: Secondary | ICD-10-CM

## 2012-09-04 DIAGNOSIS — R002 Palpitations: Secondary | ICD-10-CM

## 2012-09-04 DIAGNOSIS — R61 Generalized hyperhidrosis: Secondary | ICD-10-CM

## 2012-09-04 HISTORY — DX: Unspecified atrial fibrillation: I48.91

## 2012-09-04 MED ORDER — DABIGATRAN ETEXILATE MESYLATE 150 MG PO CAPS: 150.0000 mg | ORAL_CAPSULE | Freq: Two times a day (BID) | ORAL | Status: DC

## 2012-09-04 MED ORDER — FUROSEMIDE 20 MG PO TABS: 20.0000 mg | ORAL_TABLET | Freq: Every day | ORAL | Status: DC

## 2012-09-04 NOTE — Telephone Encounter (Signed)
Please call patient and let her know I would also like to get a chest x-ray. That I apologize that I thought about it later. It's important to rule out pneumonia as a cause of new onset atrial fibrillation. It in the she's not really having any symptoms of this. She can go anytime. The orders are in place.

## 2012-09-04 NOTE — Progress Notes (Signed)
Subjective:    Patient ID: Brooke Cunningham, female    DOB: [birth year], 79 y.o.   MRN: [id number]  HPI Brooke Cunningham complains of irregular heart beat starting last pm with sweating. Denies chest pain or shortness of breath.  Says she feels bette this AM. Lasted 30 min. No CP with it.  No HA or GI sxs.  She is on plavix and Aspirin daily. She is off her metoprolol and has been for about 3 weeks. When i saw her 2 weeks ago she was supposed to restart it but never did.     Review of Systems     BP 128/79  Pulse 111  Resp 18  Wt 125 lb (56.7 kg)  SpO2 97%    Allergies  Allergen Reactions  . Sertraline Other (See Comments)    SIADH  . Penicillins Rash    No past medical history on file.  Past Surgical History  Procedure Date  . Carotid stent     left  . Femoral artery stent     right    History   Social History  . Marital Status: Married    Spouse Name: Michelle Piper    Number of Children: N/A  . Years of Education: N/A   Occupational History  . Not on file.   Social History Main Topics  . Smoking status: Never Smoker   . Smokeless tobacco: Not on file  . Alcohol Use: No  . Drug Use: No  . Sexually Active: Not Currently   Other Topics Concern  . Not on file   Social History Narrative   2 caffeine drinks per day.  Walks for exercise.     Family History  Problem Relation Age of Onset  . Pancreatic cancer    . Hyperlipidemia Sister   . Hypertension Mother   . Stroke Mother   . Stroke Sister     Outpatient Encounter Prescriptions as of 09/04/2012  Medication Sig Dispense Refill  . aspirin 81 MG tablet Take 160 mg by mouth daily.      . clopidogrel (PLAVIX) 75 MG tablet Take 1 tablet (75 mg total) by mouth daily.  30 tablet  6  . levothyroxine (SYNTHROID, LEVOTHROID) 100 MCG tablet Take 1 tablet (100 mcg total) by mouth daily. Generic please.  30 tablet  1  . lovastatin (MEVACOR) 40 MG tablet Take 1 tablet (40 mg total) by mouth every other day.  90 tablet  3  .  metoprolol tartrate (LOPRESSOR) 25 MG tablet Take 1 tablet (25 mg total) by mouth 2 (two) times daily.  180 tablet  1  . nitroGLYCERIN (NITROSTAT) 0.4 MG SL tablet Place 1 tablet (0.4 mg total) under the tongue as needed for chest pain.  12 tablet  0  . potassium chloride (MICRO-K) 10 MEQ CR capsule Take 1 capsule (10 mEq total) by mouth daily.  30 capsule  11  . zolpidem (AMBIEN) 5 MG tablet Take 0.5-1 tablets (2.5-5 mg total) by mouth at bedtime as needed for sleep.  30 tablet  0  . [DISCONTINUED] sertraline (ZOLOFT) 25 MG tablet Take 1 tablet (25 mg total) by mouth daily.  90 tablet  0  . dabigatran (PRADAXA) 150 MG CAPS Take 1 capsule (150 mg total) by mouth every 12 (twelve) hours.  60 capsule  0       Objective:   Physical Exam  Constitutional: She is oriented to person, place, and time. She appears well-developed and well-nourished.  HENT:  Head: Normocephalic and atraumatic.  Cardiovascular: Normal rate and normal heart sounds.        Irreg rhythm.   Pulmonary/Chest: Effort normal and breath sounds normal.  Musculoskeletal: She exhibits no edema.  Neurological: She is alert and oriented to person, place, and time.  Skin: Skin is warm and dry.  Psychiatric: She has a normal mood and affect. Her behavior is normal.          Assessment & Plan:  New onset atrial fibrillation-She is currently asymptomatic.  EKG shows rate of 93 per minute with atrial fibrillation. Also some PVCs on EKG. I will go head and have her restart her metoprolol. She was supposed to restart it 2 weeks ago but did not. I gave her written instructions. Will also check electrolytes as well as a TSH today.willl also get CXR to rule out PNA but she is asymptomatic.   Also start her on per Pradaxa 100 mg twice a day. I do have some concerns her because she started on aspirin Plavix. She certainly is at increased risk of a GI bleed with 3 blood thinners and I will have her see her cardiologist this week and they can  adjust her regimen at that point. They may decide that she's too high risk to continue the Pradaxa and may just opt to keep her on the aspirin and Plavix she has had recent stent placement back in June of 2013.  Made appt with Dr. Chales Abrahams, her cards,  for Thrusday at 3:15PM.

## 2012-09-04 NOTE — Patient Instructions (Addendum)
Please restart her metoprolol. Potentially important for you to followup with her cardiologist this week. I went ahead and added another blood thinner for atrial fibrillation but I will let her cardiologist to decide whether not he should stay on this long term. We have scheduled an appointment for you with Dr. Chales Abrahams, your cardiologist, on Thursday, January 16 at 3:15 PM.Atrial Fibrillation Your caregiver has diagnosed you with atrial fibrillation (AFib). The heart normally beats very regularly; AFib is a type of irregular heartbeat. The heart rate may be faster or slower than normal. This can prevent your heart from pumping as well as it should. AFib can be constant (chronic) or intermittent (paroxysmal). CAUSES   Atrial fibrillation may be caused by:  Heart disease, including heart attack, coronary artery disease, heart failure, diseases of the heart valves, and others.   Blood clot in the lungs (pulmonary embolism).   Pneumonia or other infections.   Chronic lung disease.   Thyroid disease.   Toxins. These include alcohol, some medications (such as decongestant medications or diet pills), and caffeine.  In some people, no cause for AFib can be found. This is referred to as Lone Atrial Fibrillation. SYMPTOMS    Palpitations or a fluttering in your chest.   A vague sense of chest discomfort.   Shortness of breath.   Sudden onset of lightheadedness or weakness.  Sometimes, the first sign of AFib can be a complication of the condition. This could be a stroke or heart failure. DIAGNOSIS   Your description of your condition may make your caregiver suspicious of atrial fibrillation. Your caregiver will examine your pulse to determine if fibrillation is present. An EKG (electrocardiogram) will confirm the diagnosis. Further testing may help determine what caused you to have atrial fibrillation. This may include chest x-ray, echocardiogram, blood tests, or CT scans. PREVENTION   If you  have previously had atrial fibrillation, your caregiver may advise you to avoid substances known to cause the condition (such as stimulant medications, and possibly caffeine or alcohol). You may be advised to use medications to prevent recurrence. Proper treatment of any underlying condition is important to help prevent recurrence. PROGNOSIS   Atrial fibrillation does tend to become a chronic condition over time. It can cause significant complications (see below). Atrial fibrillation is not usually immediately life-threatening, but it can shorten your life expectancy. This seems to be worse in women. If you have lone atrial fibrillation and are under 38 years old, the risk of complications is very low, and life expectancy is not shortened. RISKS AND COMPLICATIONS   Complications of atrial fibrillation can include stroke, chest pain, and heart failure. Your caregiver will recommend treatments for the atrial fibrillation, as well as for any underlying conditions, to help minimize risk of complications. TREATMENT   Treatment for AFib is divided into several categories:  Treatment of any underlying condition.   Converting you out of AFib into a regular (sinus) rhythm.   Controlling rapid heart rate.   Prevention of blood clots and stroke.  Medications and procedures are available to convert your atrial fibrillation to sinus rhythm. However, recent studies have shown that this may not offer you any advantage, and cardiac experts are continuing research and debate on this topic. More important is controlling your rapid heartbeat. The rapid heartbeat causes more symptoms, and places strain on your heart. Your caregiver will advise you on the use of medications that can control your heart rate. Atrial fibrillation is a strong stroke risk. You can  lessen this risk by taking blood thinning medications such as Coumadin (warfarin), or sometimes aspirin. These medications need close monitoring by your caregiver.  Over-medication can cause bleeding. Too little medication may not protect against stroke. HOME CARE INSTRUCTIONS    If your caregiver prescribed medicine to make your heartbeat more normally, take as directed.   If blood thinners were prescribed by your caregiver, take EXACTLY as directed.   Perform blood tests EXACTLY as directed.   Quit smoking. Smoking increases your cardiac and lung (pulmonary) risks.   DO NOT drink alcohol.   DO NOT drink caffeinated drinks (e.g. coffee, soda, chocolate, and leaf teas). You may drink decaffeinated coffee, soda or tea.   If you are overweight, you should choose a reduced calorie diet to lose weight. Please see a registered dietitian if you need more information about healthy weight loss. DO NOT USE DIET PILLS as they may aggravate heart problems.   If you have other heart problems that are causing AFib, you may need to eat a low salt, fat, and cholesterol diet. Your caregiver will tell you if this is necessary.   Exercise every day to improve your physical fitness. Stay active unless advised otherwise.   If your caregiver has given you a follow-up appointment, it is very important to keep that appointment. Not keeping the appointment could result in heart failure or stroke. If there is any problem keeping the appointment, you must call back to this facility for assistance.  SEEK MEDICAL CARE IF:  You notice a change in the rate, rhythm or strength of your heartbeat.   You develop an infection or any other change in your overall health status.  SEEK IMMEDIATE MEDICAL CARE IF:    You develop chest pain, abdominal pain, sweating, weakness or feel sick to your stomach (nausea).   You develop shortness of breath.   You develop swollen feet and ankles.   You develop dizziness, numbness, or weakness of your face or limbs, or any change in vision or speech.  MAKE SURE YOU:    Understand these instructions.   Will watch your condition.   Will get  help right away if you are not doing well or get worse.  Document Released: 08/09/2005 Document Revised: 11/01/2011 Document Reviewed: 03/13/2008 [facility] Patient Information 2013 [place], [place].

## 2012-09-05 LAB — COMPLETE METABOLIC PANEL WITH GFR
BUN: 11 mg/dL (ref 6–23)
CO2: 26 mEq/L (ref 19–32)
Creat: 0.83 mg/dL (ref 0.50–1.10)
GFR, Est African American: 76 mL/min
GFR, Est Non African American: 66 mL/min
Glucose, Bld: 95 mg/dL (ref 70–99)
Sodium: 133 mEq/L — ABNORMAL LOW (ref 135–145)
Total Bilirubin: 0.6 mg/dL (ref 0.3–1.2)
Total Protein: 6.3 g/dL (ref 6.0–8.3)

## 2012-09-06 ENCOUNTER — Encounter: Payer: Self-pay | Admitting: *Deleted

## 2012-09-13 ENCOUNTER — Encounter: Payer: Self-pay | Admitting: Family Medicine

## 2012-09-13 ENCOUNTER — Ambulatory Visit (INDEPENDENT_AMBULATORY_CARE_PROVIDER_SITE_OTHER): Payer: Medicare Other | Admitting: Family Medicine

## 2012-09-13 ENCOUNTER — Other Ambulatory Visit: Payer: Self-pay

## 2012-09-13 ENCOUNTER — Telehealth: Payer: Self-pay | Admitting: Family Medicine

## 2012-09-13 VITALS — BP 108/58 | HR 54 | Resp 16 | Wt 120.0 lb

## 2012-09-13 DIAGNOSIS — I959 Hypotension, unspecified: Secondary | ICD-10-CM

## 2012-09-13 DIAGNOSIS — I509 Heart failure, unspecified: Secondary | ICD-10-CM

## 2012-09-13 DIAGNOSIS — E871 Hypo-osmolality and hyponatremia: Secondary | ICD-10-CM

## 2012-09-13 DIAGNOSIS — I2510 Atherosclerotic heart disease of native coronary artery without angina pectoris: Secondary | ICD-10-CM

## 2012-09-13 DIAGNOSIS — I4891 Unspecified atrial fibrillation: Secondary | ICD-10-CM

## 2012-09-13 MED ORDER — ZOLPIDEM TARTRATE 5 MG PO TABS: 2.5000 mg | ORAL_TABLET | Freq: Every evening | ORAL | Status: DC | PRN

## 2012-09-13 NOTE — Telephone Encounter (Signed)
Please call Dr. Chales Abrahams her cardiologist. Let him know that she did have a stent back in the summer and is currently taking aspirin plus Plavix. She was recently diagnosed with atrial fibrillation. We have held off on Pradaxa because of increased bleeding risk at this point time because of the concomitant aspirin, Plavix and the fact that she is 79 years old. But I want Dr. Chales Abrahams to make a final decision, as to which anticoagulants he would like her on. I be happy to write the prescription if he feels it is warranted.

## 2012-09-13 NOTE — Telephone Encounter (Signed)
Called and left message with Dr Marcille Blanco nurse. I also faxed the phone note to his office.

## 2012-09-13 NOTE — Progress Notes (Signed)
  Subjective:    Patient ID: Brooke Cunningham, female    DOB: 12/12/1928, 79 y.o.   MRN: [id number]  HPI Brooke Cunningham is here to follow up hospital visit. I had seen her about 2 weeks ago. She was a new set atrial fibrillation. We also did a chest x-ray that showed a small amount of fluid on the lungs. I called her and asked her to increase her Lasix to 2 tabs daily. We called and got her an appointment to see her cardiologist 3 days later while she was here with new-onset atrial fibrillation.  She did not increase her fluid pill and the night before her appointment with cardiology she became more short of breath. She went to the emergency department at [facility]. She was diagnosed with acute congestive heart failure. Note, she's also had a cardiac stent placed over the summer of this year and is on aspirin and Plavix. She was given multiple new medications. She was restarted on Sertraline 25 mg- She does have this medication listed under her Allergies as SIADH. Cartia 120 mg and Lisinopril 20 mg was also added. They inc her lasix to 40mg  and added amlodipine 10 mg.   She was unable to keep her appointment with Cardiology bc was in the hospital. Her pradaxa hasn't been approved yet bc of bleeding conscerns since already on plavix and ASA. She hasn't called to reschedule her cardiology appointment and it is for the middle of February. They did schedule her for a stress test in one week.  Has f/u with cardiology in about 3 weeks.   Review of Systems     Objective:   Physical Exam  Constitutional: She is oriented to person, place, and time. She appears well-developed and well-nourished.  HENT:  Head: Normocephalic and atraumatic.  Cardiovascular: Normal rate, regular rhythm and normal heart sounds.   Pulmonary/Chest: Effort normal and breath sounds normal.  Neurological: She is alert and oriented to person, place, and time.  Skin: Skin is warm and dry.  Psychiatric: She has a normal mood and affect. Her  behavior is normal.          Assessment & Plan:  Congestive heart failure-she's doing well her increased dose of Lasix. Her short of breath has improved and her lungs are clear today. She does feel weak and tired.  Atrial fibrillation-I. think she's less symptomatic now. Her heart rate is well-controlled. I will have her hold off on the Pradaxa until metal to get in touch with the cardiologist Dr. Chales Abrahams. She's not had a point with him for about 3 weeks and I don't want to wait that long to have a final decision.  Hypertension-her blood pressures too low today. She's felt extremely weak and washed out at the last 2 days. I'm going to stop her amlodipine at this point in time. I think she's very well rate controlled and does not need any additional blood pressure lowering at this time.  Coronary artery disease status post stent-we will continue her aspirin and Plavix.  Hyponatremia-she does run borderline this time and per her husband's report they had to  Depression due to dementia-they restarted her on sertraline. Certainly we can continue this if she would like but will definitely need to monitor her again for worsening sodium levels.

## 2012-09-14 ENCOUNTER — Encounter: Payer: Self-pay | Admitting: Family Medicine

## 2012-09-14 DIAGNOSIS — I509 Heart failure, unspecified: Secondary | ICD-10-CM | POA: Insufficient documentation

## 2012-09-14 HISTORY — DX: Heart failure, unspecified: I50.9

## 2012-09-17 NOTE — Telephone Encounter (Signed)
Can  You try to call them once more. They have had several business days to get back with [place].

## 2012-09-18 NOTE — Telephone Encounter (Signed)
Dr Chales Abrahams moved her appointment up to February 3 rd. He will then decide on a plan for her AFIB.

## 2012-09-20 ENCOUNTER — Ambulatory Visit: Payer: Medicare Other | Admitting: Family Medicine

## 2012-09-22 DIAGNOSIS — I2510 Atherosclerotic heart disease of native coronary artery without angina pectoris: Secondary | ICD-10-CM

## 2012-09-22 DIAGNOSIS — I509 Heart failure, unspecified: Secondary | ICD-10-CM

## 2012-09-22 DIAGNOSIS — I4891 Unspecified atrial fibrillation: Secondary | ICD-10-CM

## 2012-09-22 DIAGNOSIS — J189 Pneumonia, unspecified organism: Secondary | ICD-10-CM

## 2012-10-13 ENCOUNTER — Ambulatory Visit (INDEPENDENT_AMBULATORY_CARE_PROVIDER_SITE_OTHER): Payer: Medicare Other | Admitting: Family Medicine

## 2012-10-13 ENCOUNTER — Encounter: Payer: Self-pay | Admitting: Family Medicine

## 2012-10-13 VITALS — BP 134/64 | HR 72 | Ht 65.0 in | Wt 120.0 lb

## 2012-10-13 DIAGNOSIS — I10 Essential (primary) hypertension: Secondary | ICD-10-CM

## 2012-10-13 DIAGNOSIS — I509 Heart failure, unspecified: Secondary | ICD-10-CM

## 2012-10-13 DIAGNOSIS — I4891 Unspecified atrial fibrillation: Secondary | ICD-10-CM

## 2012-10-13 MED ORDER — POTASSIUM CHLORIDE ER 10 MEQ PO CPCR: 10.0000 meq | ORAL_CAPSULE | Freq: Every morning | ORAL | Status: DC

## 2012-10-13 MED ORDER — FUROSEMIDE 40 MG PO TABS: 40.0000 mg | ORAL_TABLET | Freq: Every morning | ORAL | Status: DC

## 2012-10-13 MED ORDER — LISINOPRIL 10 MG PO TABS: 10.0000 mg | ORAL_TABLET | Freq: Every day | ORAL | Status: DC

## 2012-10-13 NOTE — Progress Notes (Signed)
  Subjective:    Patient ID: Brooke Cunningham, female    DOB: Feb 03, 1929, 79 y.o.   MRN: [id number]  HPI Here for hospital f/u.  Was admitted to [facility] for possible CHF. She is now on lasxi 40mg   Daily. She feels well. No SOB since d/c from hospital.  Needs new rx written in our days.  She was there for 3 days.  I do have the admission note I do not have the discharge summary from hospital. We will call her regional to get a copy of this. Initially they thought she may have had pneumonia but the Levaquin was stopped. She was not discharged home on any antibiotics. She says she has felt well since discharge. She feels like her medications are working well and has reports that she's been taking them regularly. She is Re: followed up with cardiology and they have recheck her electrolytes per her husband.  HTN- Pt denies chest pain, SOB, dizziness, or heart palpitations.  Taking meds as directed w/o problems.  Denies medication side effects.  Review of Systems     Objective:   Physical Exam  Constitutional: She is oriented to person, place, and time. She appears well-developed and well-nourished.  HENT:  Head: Normocephalic and atraumatic.  Neck: Neck supple. No thyromegaly present.  Cardiovascular: Normal rate, regular rhythm and normal heart sounds.   Pulmonary/Chest: Effort normal and breath sounds normal.  Musculoskeletal: She exhibits no edema.  Lymphadenopathy:    She has no cervical adenopathy.  Neurological: She is alert and oriented to person, place, and time.  Skin: Skin is warm and dry.  Psychiatric: She has a normal mood and affect. Her behavior is normal.          Assessment & Plan:  CHF - she seems to be euvolemic today. No crackles on exam. No fluid around the ankles. I. think she's tolerating her Lasix well. I'm not really sure what may have caused her recent exacerbation. In fact she been hospitalized month prior and they have adjusted her Lasix dose at that time. The  only new addition to her regimen is amiodarone. She's tolerating it well so far. She does have a followup with her cardiologist in about 2 months. She has seen him since her discharge. Her husband reports that they did do blood work and check her electrolytes already. Refill sent to pharmacy for Lasix, potassium, lisinopril. Importance of these medications.  A-fib - well-controlled. She's asymptomatic.  HTn- well-controlled. Continue current regimen. Followup in 4 months.

## 2012-10-30 ENCOUNTER — Other Ambulatory Visit: Payer: Self-pay | Admitting: Family Medicine

## 2012-11-28 ENCOUNTER — Other Ambulatory Visit: Payer: Self-pay | Admitting: Family Medicine

## 2012-12-13 ENCOUNTER — Ambulatory Visit (INDEPENDENT_AMBULATORY_CARE_PROVIDER_SITE_OTHER): Payer: Medicare Other | Admitting: Sports Medicine

## 2012-12-13 DIAGNOSIS — M25519 Pain in unspecified shoulder: Secondary | ICD-10-CM

## 2012-12-13 DIAGNOSIS — M25511 Pain in right shoulder: Secondary | ICD-10-CM | POA: Insufficient documentation

## 2012-12-13 NOTE — Progress Notes (Signed)
   Subjective:    I'm seeing this patient as a consultation for:  Dr. Linford Arnold  CC: Right shoulder pain  HPI: This is a very pleasant 79 year old female who reports a couple of week history of pain she localizes over her right deltoid radiating down the arm but not past the elbow. Pain is waking her up from sleep, denies trauma, worse with overhead activities. No pain in the neck, no numbness or tingling in the hands. Has been using Tylenol without any improvement. Pain is moderate. Stable.  Past medical history, Surgical history, Family history not pertinant except as noted below, Social history, Allergies, and medications have been entered into the medical record, reviewed, and no changes needed.   Review of Systems: No headache, visual changes, nausea, vomiting, diarrhea, constipation, dizziness, abdominal pain, skin rash, fevers, chills, night sweats, weight loss, swollen lymph nodes, body aches, joint swelling, muscle aches, chest pain, shortness of breath, mood changes, visual or auditory hallucinations.   Objective:   General: Well Developed, well nourished, and in no acute distress.  Neuro/Psych: Alert and oriented x3, extra-ocular muscles intact, able to move all 4 extremities, sensation grossly intact. Skin: Warm and dry, no rashes noted.  Respiratory: Not using accessory muscles, speaking in full sentences, trachea midline.  Cardiovascular: Pulses palpable, no extremity edema. Abdomen: Does not appear distended. Right Shoulder: Inspection reveals no abnormalities, atrophy or asymmetry. Palpation is normal with no tenderness over AC joint or bicipital groove. ROM is full in all planes. Rotator cuff strength normal throughout. Positive Neer's, Hawkin's, and Empty Can signs. Speeds and Yergason's tests normal. No labral pathology noted with negative Obrien's, negative clunk and good stability. Normal scapular function observed. No painful arc and no drop arm sign. No  apprehension sign  Procedure: Real-time Ultrasound Guided Injection of right subacromial bursa Device: GE Logiq E  Ultrasound guided injection is preferred based studies that show increased duration, increased effect, greater accuracy, decreased procedural pain, increased response rate, and decreased cost with ultrasound guided versus blind injection.  Verbal informed consent obtained.  Time-out conducted.  Noted no overlying erythema, induration, or other signs of local infection.  Skin prepped in a sterile fashion.  Local anesthesia: Topical Ethyl chloride.  With sterile technique and under real time ultrasound guidance:  1 cc Kenalog 40, 4 cc lidocaine injected easily into the subacromial bursa. Completed without difficulty  Pain immediately resolved suggesting accurate placement of the medication.  Advised to call if fevers/chills, erythema, induration, drainage, or persistent bleeding.  Images permanently stored and available for review in the ultrasound unit.  Impression: Technically successful ultrasound guided injection.  X-ray report was read, and is negative for fracture, dislocation, or degenerative changes. Impression and Recommendations:   This case required medical decision making of moderate complexity.

## 2012-12-13 NOTE — Assessment & Plan Note (Signed)
This is likely rotator cuff syndrome. Injection as above. Home exercises. Continue Tylenol. Return in 4 weeks.

## 2012-12-22 ENCOUNTER — Ambulatory Visit (INDEPENDENT_AMBULATORY_CARE_PROVIDER_SITE_OTHER): Payer: Medicare Other | Admitting: Family Medicine

## 2012-12-22 ENCOUNTER — Encounter: Payer: Self-pay | Admitting: Family Medicine

## 2012-12-22 VITALS — BP 142/90 | HR 68 | Temp 98.7°F | Wt 118.0 lb

## 2012-12-22 DIAGNOSIS — I10 Essential (primary) hypertension: Secondary | ICD-10-CM

## 2012-12-22 DIAGNOSIS — R0982 Postnasal drip: Secondary | ICD-10-CM

## 2012-12-22 MED ORDER — HYDROCODONE-HOMATROPINE 5-1.5 MG/5ML PO SYRP: 2.5000 mL | ORAL_SOLUTION | Freq: Every evening | ORAL | Status: DC | PRN

## 2012-12-22 MED ORDER — MOMETASONE FUROATE 50 MCG/ACT NA SUSP: NASAL | Status: DC

## 2012-12-22 MED ORDER — CETIRIZINE HCL 10 MG PO TABS: 10.0000 mg | ORAL_TABLET | Freq: Every day | ORAL | Status: DC

## 2012-12-22 NOTE — Progress Notes (Signed)
CC: Brooke Cunningham is a 79 y.o. female is here for Cough   Subjective: HPI:  Patient complains of cough which is described as mild to moderate in severity and nonproductive. Has been present for 48 hours. Came on gradually and not getting better or worse since onset. Has been taking unknown brand of cough syrup without improvement. Symptoms present all hours of the day but worse when lying down at night. Nothing particularly makes it better or worse otherwise. Denies fevers, chills, shortness of breath, wheezing, chest pain, orthopnea, back pain, confusion, facial pressure, headache, abdominal pain.  Reports she is taking blood pressure medications as prescribed without irregular heartbeat, chest pain, nor limb claudication    Review Of Systems Outlined In HPI  Past Medical History  Diagnosis Date  . CVA (cerebral vascular accident) 09/21/2011    Dx 2011?   Marland Kitchen Essential hypertension, benign 09/21/2011  . CHF (congestive heart failure) 09/14/2012    Echo 09/06/12 w/ EF 50%, mild infer-post segmental LV dysfunction. Mild TR.     Marland Kitchen Atrial fibrillation 09/04/2012     Family History  Problem Relation Age of Onset  . Pancreatic cancer    . Hyperlipidemia Sister   . Hypertension Mother   . Stroke Mother   . Stroke Sister      History  Substance Use Topics  . Smoking status: Never Smoker   . Smokeless tobacco: Not on file  . Alcohol Use: No     Objective: Filed Vitals:   12/22/12 1456  BP: 142/90  Pulse:   Temp:     General: Alert and Oriented, No Acute Distress HEENT: Pupils equal, round, reactive to light. Conjunctivae clear.  External ears unremarkable, canals clear with intact TMs with appropriate landmarks.  Middle ear appears open without effusion. Pink inferior turbinates.  Moist mucous membranes, pharynx without inflammation nor lesions however moderate cobblestoning.  Neck supple without palpable lymphadenopathy nor abnormal masses. Lungs: Clear to auscultation  bilaterally, no wheezing/ronchi/rales.  Comfortable work of breathing. Good air movement. Cardiac: Regular rate and rhythm. Normal S1/S2.  No murmurs, rubs, nor gallops.   Extremities: No peripheral edema.  Strong peripheral pulses.  Mental Status: No depression, anxiety, nor agitation. Skin: Warm and dry.  Assessment & Plan: [name] was seen today for cough.  Diagnoses and associated orders for this visit:  Postnasal drip - cetirizine (ZYRTEC) 10 MG tablet; Take 1 tablet (10 mg total) by mouth daily. - mometasone (NASONEX) 50 MCG/ACT nasal spray; Two sprays each nostril daily. - HYDROcodone-homatropine (HYCODAN) 5-1.5 MG/5ML syrup; Take 2.5 mLs by mouth at bedtime as needed for cough.  Essential hypertension, benign    Essential hypertension: Uncontrolled, fortunately recheck was in low stage I hypertension. Discussed possibility of decongestant and cough syrup increasing blood pressure therefore no changes to meds today but return once respiratory symptoms improved for blood pressure followup. Postnasal drip: Discussed with patient and husband this is likely culprit of cough. Start treatment above and use of Hycodan only if needed at night.  Return in about 2 weeks (around 01/05/2013).

## 2012-12-26 ENCOUNTER — Encounter: Payer: Self-pay | Admitting: Family Medicine

## 2012-12-26 ENCOUNTER — Ambulatory Visit (INDEPENDENT_AMBULATORY_CARE_PROVIDER_SITE_OTHER): Payer: Medicare Other | Admitting: Family Medicine

## 2012-12-26 VITALS — BP 166/81 | HR 64 | Wt 118.0 lb

## 2012-12-26 DIAGNOSIS — R05 Cough: Secondary | ICD-10-CM

## 2012-12-26 MED ORDER — FLUTICASONE PROPIONATE 50 MCG/ACT NA SUSP: 2.0000 | Freq: Every day | NASAL | Status: DC

## 2012-12-26 MED ORDER — NITROGLYCERIN 0.4 MG SL SUBL: 0.4000 mg | SUBLINGUAL_TABLET | SUBLINGUAL | Status: DC | PRN

## 2012-12-26 MED ORDER — FUROSEMIDE 40 MG PO TABS: 40.0000 mg | ORAL_TABLET | Freq: Every morning | ORAL | Status: DC

## 2012-12-26 NOTE — Patient Instructions (Addendum)
Try Zyrtec at bedtime (one a day).  Store brand if fine.  Start the nasal spray in the morning.  2 squirts in each nostril once a day Follow up in 2 weeks if not better or sooner if you develop a fever or shortness of breath.

## 2012-12-26 NOTE — Progress Notes (Signed)
  Subjective:    Patient ID: Brooke Cunningham, female    DOB: 17-Feb-1929, 79 y.o.   MRN: [id number]  HPI  Julio Alm for cough 4 days ago. Now has had cough for total fo 6 days.  Saw Dr. Ivan Anchors who felt the cough was due to postnasal dose.  She never started the nasal spray or zyrtec and is back today bc her cough is not better. Using the cough syrup at bedtime, but not sure how much it is really helping.  No wheezing, SOB. She is on ACEi. Cough is dry, no fever.  He says that when the prescriptions as $90 so they did not pick it up. Home blood pressures have been running a little bit higher since taking the cough medicine.  Review of Systems     Objective:   Physical Exam  Constitutional: She is oriented to person, place, and time. She appears well-developed and well-nourished.  HENT:  Head: Normocephalic and atraumatic.  Right Ear: External ear normal.  Left Ear: External ear normal.  Nose: Nose normal.  Mouth/Throat: Oropharynx is clear and moist.  TMs and canals are clear.   Eyes: Conjunctivae and EOM are normal. Pupils are equal, round, and reactive to light.  Neck: Neck supple. No thyromegaly present.  Cardiovascular: Normal rate, regular rhythm and normal heart sounds.   Pulmonary/Chest: Effort normal and breath sounds normal. She has no wheezes.  Lymphadenopathy:    She has no cervical adenopathy.  Neurological: She is alert and oriented to person, place, and time.  Skin: Skin is warm and dry.  Psychiatric: She has a normal mood and affect.          Assessment & Plan:  Cough - no fever or other respiratory symptoms. Lung exam is completely normal today and very reassuring. I do agree with Dr. Genelle Bal evaluation that this is likely due to allergic rhinitis and possibly postnasal drip. I do recommend they try the Zyrtec as well as a nasal steroid spray. I did send over new perception for fluticasone instead of Nasonex. And return instructions to try Zyrtec over-the-counter at  bedtime. He can help with sleep as well as help control allergies. If she develops any fever or shortness of breath or wheezing and she needs to come in sooner rather than later. Otherwise followup in 2 weeks if still experiencing a cough. At that point if she's not improved we will consider changing her ACE inhibitor and get a chest x-ray for further evaluation. Okay to continue to use the cough syrup at bedtime only to help her rest and sleep.

## 2013-01-01 ENCOUNTER — Other Ambulatory Visit: Payer: Self-pay | Admitting: Family Medicine

## 2013-01-03 ENCOUNTER — Encounter: Payer: Self-pay | Admitting: Family Medicine

## 2013-01-03 ENCOUNTER — Ambulatory Visit (INDEPENDENT_AMBULATORY_CARE_PROVIDER_SITE_OTHER): Payer: Medicare Other | Admitting: Family Medicine

## 2013-01-03 ENCOUNTER — Ambulatory Visit: Payer: Medicare Other

## 2013-01-03 VITALS — BP 156/73 | HR 63 | Wt 112.0 lb

## 2013-01-03 DIAGNOSIS — R05 Cough: Secondary | ICD-10-CM

## 2013-01-03 DIAGNOSIS — J810 Acute pulmonary edema: Secondary | ICD-10-CM

## 2013-01-03 DIAGNOSIS — E039 Hypothyroidism, unspecified: Secondary | ICD-10-CM

## 2013-01-03 LAB — COMPLETE METABOLIC PANEL WITH GFR
AST: 15 U/L (ref 0–37)
Albumin: 3.7 g/dL (ref 3.5–5.2)
BUN: 11 mg/dL (ref 6–23)
Calcium: 9.3 mg/dL (ref 8.4–10.5)
Chloride: 95 mEq/L — ABNORMAL LOW (ref 96–112)
Creat: 0.98 mg/dL (ref 0.50–1.10)
GFR, Est African American: 62 mL/min
GFR, Est Non African American: 54 mL/min — ABNORMAL LOW
Glucose, Bld: 91 mg/dL (ref 70–99)
Potassium: 3.4 mEq/L — ABNORMAL LOW (ref 3.5–5.3)

## 2013-01-03 MED ORDER — LOSARTAN POTASSIUM 25 MG PO TABS: 25.0000 mg | ORAL_TABLET | Freq: Every day | ORAL | Status: DC

## 2013-01-03 NOTE — Progress Notes (Signed)
  Subjective:    Patient ID: Remi Rester, female    DOB: [birth year], 79 y.o.   MRN: [id number]  HPI Kept coughing and finally went to ED at [facility] on 12/30/12 and they did xray.  They increased her lasix to 2 a days. Chest x-ray showed cardiomegaly with prominent interstitium. There was also evidence of possible bilateral infiltrate or edema. Left was greater than the right. Her white count was completely normal so they decided to treat her for edema instead of infection. They increased her Lasix but I'm not sure that they increased her potassium. When asked her husband he really wasn't sure. He feels that she has been coughing a little bit less at night but still has a dry spasmodic cough.  Hypothyroid - would like to change to generic when runs out of current rx.  Review of Systems     Objective:   Physical Exam  Constitutional: She is oriented to person, place, and time. She appears well-developed and well-nourished.  HENT:  Head: Normocephalic and atraumatic.  Cardiovascular: Normal rate, regular rhythm and normal heart sounds.   Pulmonary/Chest: Effort normal and breath sounds normal.  Musculoskeletal: She exhibits no edema.  Neurological: She is alert and oriented to person, place, and time.  Skin: Skin is warm and dry.  Psychiatric: She has a normal mood and affect. Her behavior is normal.          Assessment & Plan:  Cough - could be from ACEi so will change to ARB. Also encouraged her to stop cough medicine such as it's not really helping since I do think it's increasing her blood pressure. I did let her know that if part of the cough is from the ACE inhibitor it may take 2 weeks for the cough to resolve.  Pulmonary edema - her lungs are clear today on exam. I will recheck her kidney function to see if everything looks stable on twice a day dosing of Lasix. I encouraged her to take her second tab at lunch instead of waiting until the evening so that way it doesn't keep her up  at night urinating. We'll also repeat a chest x-ray to make sure that the edema is improved. He has not been consider further evaluation.  Hypothyroid - will be happy to change her to generic when she runs out of brand Synthroid but I let her know that we would need to recheck her thyroid level a month after she changes because the brand and the generic or not completely rule it. She was okay with this.   Labs reviewed from the emergency department. She was also mildly hyponatremic. And hemoglobin was low at 10.7.

## 2013-01-03 NOTE — Patient Instructions (Addendum)
Move your second furosemide (lasix ) to lunch time Stop lisinopril and start losartan in its place to see if may be causing your cough.   We will call with the chest xray results.

## 2013-01-07 ENCOUNTER — Other Ambulatory Visit: Payer: Self-pay | Admitting: Family Medicine

## 2013-01-08 ENCOUNTER — Other Ambulatory Visit: Payer: Self-pay | Admitting: *Deleted

## 2013-01-08 MED ORDER — POTASSIUM CHLORIDE ER 10 MEQ PO CPCR: 10.0000 meq | ORAL_CAPSULE | Freq: Two times a day (BID) | ORAL | Status: DC

## 2013-01-08 MED ORDER — FUROSEMIDE 40 MG PO TABS: 40.0000 mg | ORAL_TABLET | Freq: Two times a day (BID) | ORAL | Status: DC

## 2013-01-12 ENCOUNTER — Encounter: Payer: Self-pay | Admitting: Family Medicine

## 2013-01-22 ENCOUNTER — Ambulatory Visit (INDEPENDENT_AMBULATORY_CARE_PROVIDER_SITE_OTHER): Payer: Medicare Other | Admitting: Family Medicine

## 2013-01-22 ENCOUNTER — Telehealth: Payer: Self-pay | Admitting: Family Medicine

## 2013-01-22 ENCOUNTER — Encounter: Payer: Self-pay | Admitting: Family Medicine

## 2013-01-22 VITALS — BP 147/68 | HR 57 | Wt 108.0 lb

## 2013-01-22 DIAGNOSIS — M25551 Pain in right hip: Secondary | ICD-10-CM

## 2013-01-22 DIAGNOSIS — M25559 Pain in unspecified hip: Secondary | ICD-10-CM

## 2013-01-22 MED ORDER — HYDROCODONE-ACETAMINOPHEN 5-325 MG PO TABS: 1.0000 | ORAL_TABLET | Freq: Four times a day (QID) | ORAL | Status: DC | PRN

## 2013-01-22 NOTE — Telephone Encounter (Signed)
Brooke Cunningham, will you please let [name] know that her xrays from today did not show any fracture or dislocation, this is good news.  I'd still recommend physical therapy to help with pain improvement and fall prevention, once they are ready for this I'd be happy to place an order.  I'd like her to f/u with Dr. Judie Petit next week for a re-check.

## 2013-01-22 NOTE — Progress Notes (Addendum)
CC: Brooke Cunningham is a 79 y.o. female is here for right hip pain   Subjective: HPI:  Patient complains of right hip pain that is moderate in severity that has been present since falling in her bathtub on May 20 of this year. She was seen that afternoon [facility] ED and had plain films of the pelvis and left hip with a report showing no acute fracture or dislocation, however did see degenerative spondylosis lower lumbosacral spine. Lumbar spine AP and lateral stable levoscoliosis diffuse degenerative spondylosis without new or acute osseous findings. Patient tells me that it has always been her right hip that has been bothering her since the accident. Symptoms are slightly improved with tramadol from the ED. Symptoms are present all hours of the day, worse with weightbearing or ambulation or sitting for long periods of time. Pain is described only as a pain without radiation. She localizes it to the buttock. She denies saddle paresthesia, bowel or bladder incontinence nor weakness in either leg. Pain has gotten no better or worse overall since accident. She denies worsening fatigue. She denies any fevers, chills, constipation, diarrhea, pelvic pain, groin pain, bruising, limb claudication.    Review Of Systems Outlined In HPI  Past Medical History  Diagnosis Date  . CVA (cerebral vascular accident) 09/21/2011    Dx 2011?   Marland Kitchen Essential hypertension, benign 09/21/2011  . CHF (congestive heart failure) 09/14/2012    Echo 09/06/12 w/ EF 50%, mild infer-post segmental LV dysfunction. Mild TR.     Marland Kitchen Atrial fibrillation 09/04/2012     Family History  Problem Relation Age of Onset  . Pancreatic cancer    . Hyperlipidemia Sister   . Hypertension Mother   . Stroke Mother   . Stroke Sister      History  Substance Use Topics  . Smoking status: Never Smoker   . Smokeless tobacco: Not on file  . Alcohol Use: No     Objective: Filed Vitals:   01/22/13 0939  BP: 147/68  Pulse: 57     General: Alert and Oriented, No Acute Distress Lungs: Clear to auscultation bilaterally, no wheezing/ronchi/rales.  Comfortable work of breathing. Good air movement. Cardiac: Regular rate and rhythm. Normal S1/S2.  No murmurs, rubs, nor gallops.   Extremities: No peripheral edema.  Strong peripheral pulses.  MSK: No pain with palpation of right issue tuberosity, pain is reproduced with palpation of inferior right SI joint. On the right leg Straight leg raise negative,FABER positive, FADIR negative.  Full ROM and strength throughout the right lower extremity with L4 and S1 DTRs two over four. No midline lower lumbar pain. Mental Status: No depression, anxiety, nor agitation. Skin: Warm and dry. No visible ecchymosis or bruising of the right buttock.  Assessment & Plan: Adin was seen today for right hip pain.  Diagnoses and associated orders for this visit:  Right hip pain - HYDROcodone-acetaminophen (NORCO/VICODIN) 5-325 MG per tablet; Take 1 tablet by mouth every 6 (six) hours as needed for pain. Take a stool softener every evening while using this medication. - DG Hip Complete Right; Future - DG Pelvis 1-2 Views; Future    Right hip pain: Uncontrolled, will rule out right intra-articular or femoral fracture with right hip films given that left hip was imaged last week. Will rule out hairline fracture of pelvis with repeat pelvic film in case this was unable to be seen on the first image. As needed hydrocodone.  Will notify family once radiology report returns from [facility].  Discussed with patient and husband that if films are negative I would like him to start physical therapy for falls and right hip pain, they would prefer to wait another week  Return in about 1 week (around 01/29/2013).

## 2013-01-23 NOTE — Telephone Encounter (Signed)
Pt spouse notified of results and will let us know when they are ready for PT

## 2013-01-25 ENCOUNTER — Encounter: Payer: Self-pay | Admitting: Family Medicine

## 2013-01-25 ENCOUNTER — Ambulatory Visit (INDEPENDENT_AMBULATORY_CARE_PROVIDER_SITE_OTHER): Payer: Medicare Other | Admitting: Family Medicine

## 2013-01-25 VITALS — BP 162/82 | HR 68 | Wt 110.0 lb

## 2013-01-25 DIAGNOSIS — F329 Major depressive disorder, single episode, unspecified: Secondary | ICD-10-CM

## 2013-01-25 DIAGNOSIS — M25559 Pain in unspecified hip: Secondary | ICD-10-CM

## 2013-01-25 DIAGNOSIS — M25551 Pain in right hip: Secondary | ICD-10-CM

## 2013-01-25 DIAGNOSIS — R531 Weakness: Secondary | ICD-10-CM

## 2013-01-25 DIAGNOSIS — R5383 Other fatigue: Secondary | ICD-10-CM

## 2013-01-25 DIAGNOSIS — R296 Repeated falls: Secondary | ICD-10-CM

## 2013-01-25 DIAGNOSIS — S76011A Strain of muscle, fascia and tendon of right hip, initial encounter: Secondary | ICD-10-CM

## 2013-01-25 DIAGNOSIS — R110 Nausea: Secondary | ICD-10-CM

## 2013-01-25 DIAGNOSIS — Z9181 History of falling: Secondary | ICD-10-CM

## 2013-01-25 MED ORDER — LEVOTHYROXINE SODIUM 100 MCG PO TABS: 100.0000 ug | ORAL_TABLET | Freq: Every day | ORAL | Status: DC

## 2013-01-25 MED ORDER — PAROXETINE HCL 20 MG PO TABS: 20.0000 mg | ORAL_TABLET | ORAL | Status: DC

## 2013-01-25 NOTE — Patient Instructions (Addendum)
Please STOP TAKING HYDROCODONE-ACETAMINOPHEN 5-325!!  Start Paxil Wolff-Parkinson-White Syndrome Wolff-Parkinson-White Syndrome (WPW) is an abnormal heart condition that can cause the heart to beat very fast. CAUSES  In WPW syndrome, an extra electrical connection (pathway) exists between the top chambers of your heart (atria) and the bottom chambers of your heart (ventricles). This is known as an extra (accessory) pathway. This extra pathway can cause the heart to short circuit and beat very fast. SYMPTOMS  Symptoms in WPW syndrome can vary. These include:  Feeling your heart "skip" beats (palpitations).  Dizziness.  Fainting or near fainting.  Sudden death. DIAGNOSIS  WPW can be diagnosed by different test such as:  ECG (electrocardiogram).  Echocardiogram.  Holter monitoring.  Stress testing.  Electrophysiology study.  Laboratory tests that check certain blood levels.  Thyroid study. TREATMENT  WPW is usually treated in two ways:  Radiofrequency destruction (ablation). In this procedure, a thin, flexible tube (catheter) is placed in the heart through a vein in the upper leg (groin). The catheter is guided to the extra pathway. The extra pathway is destroyed by using high frequency radio waves.  Medicine can sometimes be used to treat WPW. SEEK IMMEDIATE MEDICAL CARE IF:   You feel palpitations that are frequent or continual.  You develop chest pain and also have:  Shortness of breath or difficulty breathing.  Nausea and vomiting.  Sweating.  You become light-headed or faint (pass out). MAKE SURE YOU:   Understand these instructions.  Will watch your condition.  Will get help right away if you are not doing well or get worse. Document Released: 10/30/2003 Document Revised: 11/01/2011 Document Reviewed: 10/26/2008 [facility] Patient Information 2014 [place], [place].

## 2013-01-25 NOTE — Progress Notes (Addendum)
  Subjective:    Patient ID: Brooke Cunningham, female    DOB: 12/18/1928, 79 y.o.   MRN: [id number]  HPI Hip, right is still very painful. She came in and saw my partner Dr. Ines Bloomer school. He referred her for x-rays which were normal. He was worried about potential hairline fracture. She has been able to cannulate it has been very painful. She was given some narcotic pain medication. It has been helping her pain.   Pain meds are making her nauseated. Originally feel on bottom in the bathtub. Feel again yesterday.  Not sure how. Losing her balance and has been more weak. Has been losing weight  hypothyorid- No recent skin or hair changes. Would like to change to generic. She has lost some weight but they feel it's because of decreased appetite.  Has felt more down and tearful. Her husband says she actually became fairly tearful this morning.  Review of Systems     Objective:   Physical Exam  Constitutional: She is oriented to person, place, and time. She appears well-developed and well-nourished.  HENT:  Head: Normocephalic and atraumatic.  Cardiovascular: Normal rate, regular rhythm and normal heart sounds.   Pulmonary/Chest: Effort normal and breath sounds normal.  Musculoskeletal:  Right hip with sig pain with hip flexion. Strength is 5/5 but slightly decreased compared to the left hip. Knee and ankle strength is 5/5.  Tender over the right greater trochanter.   Neurological: She is alert and oriented to person, place, and time.  Skin: Skin is warm and dry.  Psychiatric: She has a normal mood and affect. Her behavior is normal.          Assessment & Plan:  Right hip pain - this could be a hip flexor tendon strain. X-rays did not show fracture that she does have some arthritis. Most of her pain is with hip flexion and with ambulation. I think she would greatly benefit from physical therapy for this. She might benefit from an injection if she's not improving. She's okay with referring to  physical therapy. I did encourage her to stop her pain medication since they seem to be making her nauseated and causing some constipation. She has been taking a stool soft with it.  Generalized weakness - I. really think she would benefit from physical therapy. I think this would help with overall generalized weakness and they can also help her with fall prevention since she is on again this week.  Hypothryroid - will change to generic levothyroxine. We will need to recheck thyroid levels in one month since these medications or not exactly equivalent but will save her a lot of money. She has no recent changes in thyroid type symptoms.  Depression - she does seem mildly depressed today. PHQ-9 score of 11.  She has taken Paxil in the past without any side effects or problems. Would like to restart is a 20 mg and see her back in one month to make sure that she's tolerating it well. Her husband is with her today and he is very supportive. She does have some early dementia which does put her at increased risk of developing depression. I'm hoping that the Paxil stimulate her appetite little as well.  Nausea - Stop the pain medications.   Time spent 25 minutes, greater than 50% of the time discussing her hip, generalized weakness, mood and thyroid.

## 2013-01-31 ENCOUNTER — Encounter: Payer: Self-pay | Admitting: Family Medicine

## 2013-02-02 ENCOUNTER — Telehealth: Payer: Self-pay | Admitting: *Deleted

## 2013-02-02 NOTE — Telephone Encounter (Signed)
Brooke Cunningham given VO for pt to get [employer].[name]

## 2013-02-12 ENCOUNTER — Telehealth: Payer: Self-pay | Admitting: *Deleted

## 2013-02-12 DIAGNOSIS — I2510 Atherosclerotic heart disease of native coronary artery without angina pectoris: Secondary | ICD-10-CM

## 2013-02-12 DIAGNOSIS — I509 Heart failure, unspecified: Secondary | ICD-10-CM

## 2013-02-12 DIAGNOSIS — I4891 Unspecified atrial fibrillation: Secondary | ICD-10-CM

## 2013-02-12 DIAGNOSIS — I739 Peripheral vascular disease, unspecified: Secondary | ICD-10-CM

## 2013-02-12 NOTE — Telephone Encounter (Signed)
Ok for this? 

## 2013-02-12 NOTE — Telephone Encounter (Signed)
Occupational therapist calls and wants to get an order for 1 additional visit with pt for her ADL with her new shower chair

## 2013-02-13 NOTE — Telephone Encounter (Signed)
LMOM with verbal order for additional HH visit for OT. Barry Dienes, LPN

## 2013-02-15 ENCOUNTER — Encounter: Payer: Self-pay | Admitting: Family Medicine

## 2013-02-15 ENCOUNTER — Ambulatory Visit (INDEPENDENT_AMBULATORY_CARE_PROVIDER_SITE_OTHER): Payer: Medicare Other | Admitting: Family Medicine

## 2013-02-15 VITALS — BP 166/78 | HR 61 | Wt 113.0 lb

## 2013-02-15 DIAGNOSIS — E871 Hypo-osmolality and hyponatremia: Secondary | ICD-10-CM

## 2013-02-15 DIAGNOSIS — I509 Heart failure, unspecified: Secondary | ICD-10-CM

## 2013-02-15 DIAGNOSIS — M7061 Trochanteric bursitis, right hip: Secondary | ICD-10-CM

## 2013-02-15 DIAGNOSIS — E876 Hypokalemia: Secondary | ICD-10-CM

## 2013-02-15 DIAGNOSIS — M76899 Other specified enthesopathies of unspecified lower limb, excluding foot: Secondary | ICD-10-CM

## 2013-02-15 DIAGNOSIS — I5042 Chronic combined systolic (congestive) and diastolic (congestive) heart failure: Secondary | ICD-10-CM

## 2013-02-15 NOTE — Addendum Note (Signed)
Addended by: Nani Gasser D on: 02/15/2013 04:52 PM   Modules accepted: Level of Service

## 2013-02-15 NOTE — Progress Notes (Addendum)
  Subjective:    Patient ID: Brooke Cunningham, female    DOB: 1928-10-14, 79 y.o.   MRN: [id number]  HPI Admitted to hospital, [facility],  for Hypokalemia and hyponatremia. They dec her lasix and decrease her potassium. Larey Seat this last time and landed on her right hip and right knee.  Had xrays that were negative for fracture.  She has been hurting when she gets up and sit. Still having pain in the right hip. No LOC. Using pain meds for her hip. No CP or SOB. No change in swelling in her extremities.  After reviewing the notes from the hospital she was admitted on June 7 and discharged 3 days later. She did have a CT of the head as well as a CT abdomen and pelvis including right hip which did not show any significant finding date. She was discharged with home health after being evaluated the physical therapy department. She was advised to use a whirling walker. They had improved her sodium and potassium levels before discharge. Her sodium was low at 132 and her potassium was low at 3.1. Kidney function, creatinine was 0.81.  Review of Systems     Objective:   Physical Exam  Constitutional: She is oriented to person, place, and time. She appears well-developed and well-nourished.  HENT:  Head: Normocephalic and atraumatic.  Cardiovascular: Normal rate, regular rhythm and normal heart sounds.   Pulmonary/Chest: Effort normal and breath sounds normal.  Musculoskeletal:  Tender over the right greater trochanter. Decreased strength with right hip flexion-extension abduction and abduction.  Neurological: She is alert and oriented to person, place, and time.  Skin: Skin is warm and dry.  Psychiatric: She has a normal mood and affect. Her behavior is normal.          Assessment & Plan:  Hypokalemia - Recheck BMP today since her been adjustments to her Lasix and her potassium. Her swelling has been stable which is reassuring. We'll recheck any function as well.  Hyponatremia-will recheck level  today.  Right hip pain - symptoms consistent with trochanteric bursitis. Discussed option of physical therapy versus possible injection. She's very been doing some home physical therapy through home health companies. She is interested in trying a shot for relief. Patient tolerated procedure well. Call if not improving over the next 2-4 weeks.  Bursa  Injection Procedure Note  Pre-operative Diagnosis: right Trochanteric bursitis  Post-operative Diagnosis: same  Indications: Diagnosis and treatment of symptomatic bursal effusion  Anesthesia: not required   Procedure Details   After a discussion of the risks and benefits with the patient (including the possibility that any manipulation of the bursa could introduce infection, worsening the current situation significantly), verbal consent was obtained for the procedure. The joint was prepped with Betadine.  An 18 gauge needle was introduced. And 1 ml of 40 mg Kenalog and 9 mL of lidocaine without epinephrine, 1% was then injected into the right trochanteric bursa. The injection site was cleansed with topical isopropyl alcohol and a dressing was applied.  Complications:  None; patient tolerated the procedure well.

## 2013-02-16 ENCOUNTER — Ambulatory Visit: Payer: Medicare Other | Admitting: Family Medicine

## 2013-02-16 LAB — BASIC METABOLIC PANEL WITH GFR
BUN: 9 mg/dL (ref 6–23)
CO2: 29 mEq/L (ref 19–32)
Chloride: 93 mEq/L — ABNORMAL LOW (ref 96–112)
Glucose, Bld: 96 mg/dL (ref 70–99)
Potassium: 4.3 mEq/L (ref 3.5–5.3)
Sodium: 130 mEq/L — ABNORMAL LOW (ref 135–145)

## 2013-02-22 ENCOUNTER — Other Ambulatory Visit: Payer: Self-pay | Admitting: *Deleted

## 2013-02-22 ENCOUNTER — Encounter: Payer: Self-pay | Admitting: Sports Medicine

## 2013-02-22 ENCOUNTER — Ambulatory Visit (INDEPENDENT_AMBULATORY_CARE_PROVIDER_SITE_OTHER): Payer: Medicare Other | Admitting: Sports Medicine

## 2013-02-22 ENCOUNTER — Telehealth: Payer: Self-pay | Admitting: *Deleted

## 2013-02-22 VITALS — BP 156/80 | HR 56 | Wt 114.0 lb

## 2013-02-22 DIAGNOSIS — E870 Hyperosmolality and hypernatremia: Secondary | ICD-10-CM

## 2013-02-22 DIAGNOSIS — G8929 Other chronic pain: Secondary | ICD-10-CM

## 2013-02-22 DIAGNOSIS — M549 Dorsalgia, unspecified: Secondary | ICD-10-CM

## 2013-02-22 NOTE — Assessment & Plan Note (Addendum)
There is exacerbation of acute on chronic low back pain after a fall 2 weeks ago. I have a low suspicion for fracture, but considering her age, she does need a lumbar spine x-ray. Continue Tylenol for pain. I would like physical therapy to come back after her house and work on lower extremity strength as well as back strengthening. I have given him an order for that. The come back to see me in about 2-3 weeks.  If no better, I do think we need to consider further imaging of her spine looking for a cause of the lower extremity weakness such as spinal stenosis.  I finally received her lumbar spine x-rays, she does have a mild T12 vertebral compression fracture most likely from the fall, at this point she does need a TLSO brace, and we need a bone density test.

## 2013-02-22 NOTE — Telephone Encounter (Signed)
Pt husband calls and states that he would like to get order sent to [facility] to have a nurse come in the home.They have OT in the home already. Barry Dienes, LPN

## 2013-02-22 NOTE — Progress Notes (Signed)
  Subjective:    CC: Fall  HPI: Brooke Cunningham is a very pleasant 79 year old female, I injected her subacromial bursa several months ago, shoulder pain has resolved. Unfortunately, a couple of weeks ago she fell directly onto her back, causing pain has been persistent for 2 weeks. She localizes the pain on the right paralumbar, and parathoracic muscles, without radiation. Denies any hip or groin pain, bowel or bladder dysfunction. She does have some lower extremity weakness which is chronic. Pain is moderate, persistent.  Past medical history, Surgical history, Family history not pertinant except as noted below, Social history, Allergies, and medications have been entered into the medical record, reviewed, and no changes needed.   Review of Systems: No fevers, chills, night sweats, weight loss, chest pain, or shortness of breath.   Objective:    General: Well Developed, well nourished, and in no acute distress.  Neuro: Alert and oriented x3, extra-ocular muscles intact, sensation grossly intact.  HEENT: Normocephalic, atraumatic, pupils equal round reactive to light, neck supple, no masses, no lymphadenopathy, thyroid nonpalpable.  Skin: Warm and dry, no rashes. Cardiac: Regular rate and rhythm, no murmurs rubs or gallops, no lower extremity edema.  Respiratory: Clear to auscultation bilaterally. Not using accessory muscles, speaking in full sentences. Back Exam:  Inspection: Unremarkable  Motion: Flexion 45 deg, Extension 45 deg, Side Bending to 45 deg bilaterally,  Rotation to 45 deg bilaterally  SLR laying: Negative  XSLR laying: Negative  Palpable tenderness: Right paralumbar and parathoracic muscles. FABER: negative. Sensory change: Gross sensation intact to all lumbar and sacral dermatomes.  Reflexes: 2+ at both patellar tendons, 2+ at achilles tendons, Babinski's downgoing.  Strength at foot  Plantar-flexion: 5/5 Dorsi-flexion: 5/5 Eversion: 5/5 Inversion: 5/5  Leg strength  Quad: 5/5  Hamstring: 5/5 Hip flexor: 5/5 Hip abductors: 5/5  Gait unremarkable. Impression and Recommendations:

## 2013-02-25 NOTE — Telephone Encounter (Signed)
Ok for order for nurse care through [facility]

## 2013-02-26 ENCOUNTER — Telehealth: Payer: Self-pay | Admitting: *Deleted

## 2013-02-26 DIAGNOSIS — M545 Low back pain, unspecified: Secondary | ICD-10-CM

## 2013-02-26 NOTE — Telephone Encounter (Signed)
Pt's spouse called and states [facility] needs an order sent for PT to continue care . Also Wallace Keller is faxing over pt's last xray report

## 2013-02-26 NOTE — Telephone Encounter (Signed)
Order placed for PT, please fax to [facility].

## 2013-02-26 NOTE — Telephone Encounter (Signed)
Pt's husband informed of approval for her to have nurse visits.Brooke Cunningham

## 2013-02-27 ENCOUNTER — Telehealth: Payer: Self-pay | Admitting: *Deleted

## 2013-02-27 ENCOUNTER — Encounter: Payer: Self-pay | Admitting: Sports Medicine

## 2013-02-27 DIAGNOSIS — M4850XA Collapsed vertebra, not elsewhere classified, site unspecified, initial encounter for fracture: Secondary | ICD-10-CM | POA: Insufficient documentation

## 2013-02-27 MED ORDER — AMBULATORY NON FORMULARY MEDICATION: Status: DC

## 2013-02-27 MED ORDER — OXYCODONE-ACETAMINOPHEN 5-325 MG PO TABS: 1.0000 | ORAL_TABLET | Freq: Three times a day (TID) | ORAL | Status: DC | PRN

## 2013-02-27 NOTE — Telephone Encounter (Signed)
Opened in error

## 2013-02-27 NOTE — Addendum Note (Signed)
Addended by: Monica Becton on: 02/27/2013 09:51 AM   Modules accepted: Orders

## 2013-02-27 NOTE — Telephone Encounter (Signed)
Patient's husband called & asked if you have received patient's xray results from [facility].  He also states that her hydrocodone isn't helping her pain & she needs something else.

## 2013-02-27 NOTE — Telephone Encounter (Signed)
She has a T12 vertebral compression fracture, I will increase her pain medication, she needs to come pick up the prescription. She also needs a TLSO brace and bone density scan, my nurse will be working on this.

## 2013-02-28 ENCOUNTER — Other Ambulatory Visit: Payer: Self-pay | Admitting: *Deleted

## 2013-02-28 MED ORDER — METOPROLOL TARTRATE 25 MG PO TABS: 25.0000 mg | ORAL_TABLET | Freq: Two times a day (BID) | ORAL | Status: DC

## 2013-02-28 NOTE — Telephone Encounter (Signed)
Pt husband notified can come pick up rx and will be called when gets brace and bone density scheduled. Barry Dienes, LPN

## 2013-03-01 ENCOUNTER — Ambulatory Visit: Payer: Medicare Other | Admitting: Family Medicine

## 2013-03-01 ENCOUNTER — Telehealth: Payer: Self-pay | Admitting: *Deleted

## 2013-03-01 DIAGNOSIS — Z13820 Encounter for screening for osteoporosis: Secondary | ICD-10-CM

## 2013-03-01 DIAGNOSIS — Z0289 Encounter for other administrative examinations: Secondary | ICD-10-CM

## 2013-03-01 NOTE — Telephone Encounter (Signed)
dexa order sent to [facility] [place].

## 2013-03-01 NOTE — Telephone Encounter (Signed)
Helen from [facility] called & states that since pt has [employer]-medicare, [facility]  FYI

## 2013-03-01 NOTE — Telephone Encounter (Signed)
OK, just send the order there and get her scheduled.  This [employer] thing is a PITA!

## 2013-03-14 ENCOUNTER — Encounter: Payer: Self-pay | Admitting: Family Medicine

## 2013-03-14 ENCOUNTER — Ambulatory Visit (INDEPENDENT_AMBULATORY_CARE_PROVIDER_SITE_OTHER): Payer: Medicare Other | Admitting: Family Medicine

## 2013-03-14 VITALS — BP 136/72 | Wt 106.0 lb

## 2013-03-14 DIAGNOSIS — M810 Age-related osteoporosis without current pathological fracture: Secondary | ICD-10-CM | POA: Insufficient documentation

## 2013-03-14 DIAGNOSIS — E871 Hypo-osmolality and hyponatremia: Secondary | ICD-10-CM

## 2013-03-14 DIAGNOSIS — M8008XS Age-related osteoporosis with current pathological fracture, vertebra(e), sequela: Secondary | ICD-10-CM

## 2013-03-14 DIAGNOSIS — IMO0002 Reserved for concepts with insufficient information to code with codable children: Secondary | ICD-10-CM

## 2013-03-14 DIAGNOSIS — E559 Vitamin D deficiency, unspecified: Secondary | ICD-10-CM

## 2013-03-14 MED ORDER — ALENDRONATE SODIUM 70 MG PO TABS: 70.0000 mg | ORAL_TABLET | ORAL | Status: DC

## 2013-03-14 NOTE — Patient Instructions (Addendum)
For your fracture pain: Stop the Tizanidine,which is a muscle relaxer. Decrease the gabapentin 100 mg 2 once a day at bedtime only and see if she tolerates this okay. If she's making him sedated her groggy then stop it completely. Okay to use the oxycodone but recommend trying half a tab every 8 hours as needed. If after one hour still having significant pain, then can take the other half of a tab.  For your osteoporosis: Start alendronate once a week to help with bone strength Continue your calcium with vitamin D

## 2013-03-14 NOTE — Progress Notes (Signed)
  Subjective:    Patient ID: Brooke Cunningham, female    DOB: 1928-12-07, 79 y.o.   MRN: [id number]  HPI Has MRI scheduled for tomorrow at [facility]  Seeing Fredderick Severance for spinal fracture.  She has a followup MRI later this week. And she will see him afterwards. Overall she is still having some significant pain.  Some worse with activity. She was given a prescription for gabapentin and tizanidine. After a day of taking it she was excessively sedated and actually fell against her husband stopped them. He has given her the oxycodone a couple of times when her pain has been more severe. I asked her to rate her pain today and she was unable to give me a number. She just says she hurts.  Review of Systems     Objective:   Physical Exam  Constitutional: She is oriented to person, place, and time. She appears well-developed and well-nourished.  HENT:  Head: Normocephalic and atraumatic.  Eyes: Conjunctivae and EOM are normal.  Cardiovascular: Normal rate.   Pulmonary/Chest: Effort normal.  Musculoskeletal:  She is wearing her back brace today.  Neurological: She is alert and oriented to person, place, and time.  Skin: Skin is dry. No pallor.  Psychiatric: She has a normal mood and affect. Her behavior is normal.          Assessment & Plan:  Osteoporotic vertebral fracture-she is Re: taking calcium and vitamin D which is fantastic. Encouraged her to continue to do this. We will check vitamin D levels today to make sure that she's not sufficient. I suspect that she is as she gets very little sun exposure. I encouraged her to stop the tizanidine because it's been too sedating. Also recommend decrease the gabapentin to once a day at bedtime because also very sedating. She does still have some oxycodone at home and has been helpful for pain. Her husband is very concerned about giving it to her. Recommend he try half a tab every 8 hours as needed and see if this is a little better with less  sedation. Continue to wear back brace and he followup with her orthopedist. We do need to order a bone density test on her but that she's having the MRI this week she wants to hold off. I will see her back in 2 months and at that point we can order the bone density test. I would like to get a baseline so that we can follow this over time.  Hyponatremia-we'll recheck sodium level today.

## 2013-04-09 ENCOUNTER — Telehealth: Payer: Self-pay | Admitting: *Deleted

## 2013-04-09 NOTE — Telephone Encounter (Signed)
Patient husband called stated that the Synthroid medication is not working for patient he wants to know if she can go back to the brand name because its making her have headaches.

## 2013-04-10 MED ORDER — SYNTHROID 100 MCG PO TABS: 100.0000 ug | ORAL_TABLET | Freq: Every day | ORAL | Status: DC

## 2013-04-10 NOTE — Telephone Encounter (Signed)
Ok to change back to brand

## 2013-04-10 NOTE — Telephone Encounter (Signed)
Brand Synthroid sent to pharmacy. Barry Dienes, LPN

## 2013-04-16 ENCOUNTER — Other Ambulatory Visit: Payer: Self-pay | Admitting: Family Medicine

## 2013-04-17 ENCOUNTER — Ambulatory Visit: Payer: Medicare Other | Admitting: Family Medicine

## 2013-04-17 ENCOUNTER — Ambulatory Visit (INDEPENDENT_AMBULATORY_CARE_PROVIDER_SITE_OTHER): Payer: Medicare Other | Admitting: Family Medicine

## 2013-04-17 ENCOUNTER — Encounter: Payer: Self-pay | Admitting: Family Medicine

## 2013-04-17 VITALS — BP 110/62 | HR 40 | Wt 104.8 lb

## 2013-04-17 DIAGNOSIS — E039 Hypothyroidism, unspecified: Secondary | ICD-10-CM

## 2013-04-17 DIAGNOSIS — E871 Hypo-osmolality and hyponatremia: Secondary | ICD-10-CM

## 2013-04-17 DIAGNOSIS — R001 Bradycardia, unspecified: Secondary | ICD-10-CM

## 2013-04-17 DIAGNOSIS — I10 Essential (primary) hypertension: Secondary | ICD-10-CM

## 2013-04-17 DIAGNOSIS — IMO0002 Reserved for concepts with insufficient information to code with codable children: Secondary | ICD-10-CM

## 2013-04-17 DIAGNOSIS — I498 Other specified cardiac arrhythmias: Secondary | ICD-10-CM

## 2013-04-17 NOTE — Patient Instructions (Addendum)
Hold your metoprolol

## 2013-04-17 NOTE — Progress Notes (Signed)
Subjective:    Patient ID: Brooke Cunningham, female    DOB: Jan 07, 1929, 79 y.o.   MRN: [id number]  HPI BP was high yesterday at the pharmacy.  BP at [facility] was high in the 160s. And pulse was in 30s.  No CP or SOB. No palpitations.  No acute onset of lower extremity swelling. Not in sig pain yesterday.  Her back has been less painful.  Says the nerve pill being used to relax th e muscle (zanaflex) has helped her appetite.  She takes her medications really including her amiodarone, metoprolol.  No recent changes to her medication. She was supposed to followup with her cardiologist sometime last month but has a lot going on with her health and had to cancel the appointment. They have not rescheduled it yet.  Vertebral fracture. Using half a pain pill occ. On zanaflex BID and helping.  She is getting better.  Continue to minimize pain medications and muscle relaxers.  Review of Systems  BP 110/62  Pulse 40  Wt 104 lb 12.8 oz (47.537 kg)  BMI 17.44 kg/m2  SpO2 94%    Allergies  Allergen Reactions  . Sertraline Other (See Comments)    SIADH  . Penicillins Rash    Past Medical History  Diagnosis Date  . CVA (cerebral vascular accident) 09/21/2011    Dx 2011?   Marland Kitchen Essential hypertension, benign 09/21/2011  . CHF (congestive heart failure) 09/14/2012    Echo 09/06/12 w/ EF 50%, mild infer-post segmental LV dysfunction. Mild TR.     Marland Kitchen Atrial fibrillation 09/04/2012    Past Surgical History  Procedure Laterality Date  . Carotid stent      left  . Femoral artery stent      right    History   Social History  . Marital Status: Married    Spouse Name: Michelle Piper    Number of Children: N/A  . Years of Education: N/A   Occupational History  . Not on file.   Social History Main Topics  . Smoking status: Never Smoker   . Smokeless tobacco: Not on file  . Alcohol Use: No  . Drug Use: No  . Sexual Activity: Not Currently   Other Topics Concern  . Not on file   Social History Narrative    2 caffeine drinks per day.  Walks for exercise.     Family History  Problem Relation Age of Onset  . Pancreatic cancer    . Hyperlipidemia Sister   . Hypertension Mother   . Stroke Mother   . Stroke Sister     Outpatient Encounter Prescriptions as of 04/17/2013  Medication Sig Dispense Refill  . alendronate (FOSAMAX) 70 MG tablet Take 1 tablet (70 mg total) by mouth every 7 (seven) days. Take with a full glass of water on an empty stomach.  4 tablet  11  . amiodarone (PACERONE) 200 MG tablet Take 200 mg by mouth daily.      . calcium-vitamin D (OSCAL WITH D) 500-200 MG-UNIT per tablet Take 1 tablet by mouth.      . cetirizine (ZYRTEC) 10 MG tablet Take 1 tablet (10 mg total) by mouth daily.  30 tablet  0  . clopidogrel (PLAVIX) 75 MG tablet Take one tablet by mouth one time daily  30 tablet  5  . diltiazem (CARDIZEM CD) 120 MG 24 hr capsule Take 120 mg by mouth daily.      . fluticasone (FLONASE) 50 MCG/ACT nasal spray Place 2 sprays  into the nose daily.  16 g  1  . furosemide (LASIX) 40 MG tablet Take 20 mg by mouth daily.      Marland Kitchen gabapentin (NEURONTIN) 100 MG capsule Take 100 mg by mouth 3 (three) times daily.      Marland Kitchen levothyroxine (SYNTHROID, LEVOTHROID) 100 MCG tablet Take 1 tablet (100 mcg total) by mouth daily before breakfast. Generic Please  30 tablet  1  . losartan (COZAAR) 25 MG tablet Take 1 tablet (25 mg total) by mouth daily.  30 tablet  1  . lovastatin (MEVACOR) 40 MG tablet Take 1 tablet (40 mg total) by mouth every other day.  90 tablet  3  . metoprolol tartrate (LOPRESSOR) 25 MG tablet Take 1 tablet (25 mg total) by mouth 2 (two) times daily.  180 tablet  1  . nitroGLYCERIN (NITROSTAT) 0.4 MG SL tablet Place 1 tablet (0.4 mg total) under the tongue as needed for chest pain.  12 tablet  0  . oxyCODONE-acetaminophen (PERCOCET/ROXICET) 5-325 MG per tablet Take 1 tablet by mouth every 8 (eight) hours as needed for pain.  30 tablet  0  . PARoxetine (PAXIL) 20 MG tablet Take 1  tablet (20 mg total) by mouth every morning.  30 tablet  1  . potassium chloride (MICRO-K) 10 MEQ CR capsule Take 10 mEq by mouth daily.      Marland Kitchen SYNTHROID 100 MCG tablet Take 1 tablet (100 mcg total) by mouth daily.  30 tablet  11  . tiZANidine (ZANAFLEX) 4 MG tablet Take 4 mg by mouth every 6 (six) hours as needed.      . [DISCONTINUED] AMBULATORY NON FORMULARY MEDICATION Thoracolumbosacral orthosis worn throughout the day.  1 each  0   No facility-administered encounter medications on file as of 04/17/2013.          Objective:   Physical Exam  Constitutional: She is oriented to person, place, and time. She appears well-developed and well-nourished.  HENT:  Head: Normocephalic and atraumatic.  Cardiovascular: Normal rate, regular rhythm and normal heart sounds.   Pulmonary/Chest: Effort normal and breath sounds normal.  Musculoskeletal: She exhibits no edema.  Neurological: She is alert and oriented to person, place, and time.  Skin: Skin is warm and dry.  Psychiatric: She has a normal mood and affect. Her behavior is normal.          Assessment & Plan:  Bradycardia - HR is definitely low on exam today.  Hx of HTN, CAD, CHF.  Maybe concerning for recent MI or damage to the heart. She does have a history of hyperthyroidism in the last which was in the hospital which I believe was in June her thyroid medication was adjusted. Definitely recheck a thyroid level today to make sure that her levels are adequate and she is not hypo-or hyperthyroid. We will also hold her metoprolol for now. EKG shows a rate of 38 beats per minute, recurred it, inverted T wave in lead 3 and aVF with possible old infarct in the inferior leads, left ventricular hypertrophy with left axis deviation. Again, for now we will hold her metoprolol. Rectus down for her husband. Certainly the diltiazem and amiodarone can be affecting her right as well. We are going to try and get her in with her cardiologist this week for  either early next week. Encouraged her to stay well hydrated and if her heart rate is still staying below 45, a holding the metoprolol, to call me. We will likely hold the  Cardizem as well. No signs of acute congestive heart failure at this time.  HTN - BP well controlled today.  Vertebral fracture - will order bone density.    Hyponatremia- due to recent labs. Repeat today.

## 2013-04-18 ENCOUNTER — Other Ambulatory Visit: Payer: Self-pay | Admitting: Family Medicine

## 2013-04-18 LAB — VITAMIN D 25 HYDROXY (VIT D DEFICIENCY, FRACTURES): Vit D, 25-Hydroxy: 42 ng/mL (ref 30–89)

## 2013-04-18 LAB — BASIC METABOLIC PANEL WITH GFR
GFR, Est African American: 60 mL/min
Glucose, Bld: 106 mg/dL — ABNORMAL HIGH (ref 70–99)
Potassium: 3.7 mEq/L (ref 3.5–5.3)
Sodium: 138 mEq/L (ref 135–145)

## 2013-04-18 MED ORDER — SYNTHROID 112 MCG PO TABS: 112.0000 ug | ORAL_TABLET | Freq: Every day | ORAL | Status: DC

## 2013-04-19 ENCOUNTER — Encounter: Payer: Self-pay | Admitting: *Deleted

## 2013-05-25 ENCOUNTER — Ambulatory Visit: Payer: Medicare Other | Admitting: Sports Medicine

## 2013-06-01 ENCOUNTER — Ambulatory Visit: Payer: Medicare Other | Admitting: Family Medicine

## 2013-06-06 ENCOUNTER — Ambulatory Visit (INDEPENDENT_AMBULATORY_CARE_PROVIDER_SITE_OTHER): Payer: Medicare Other | Admitting: Family Medicine

## 2013-06-06 ENCOUNTER — Ambulatory Visit (INDEPENDENT_AMBULATORY_CARE_PROVIDER_SITE_OTHER): Payer: Medicare Other

## 2013-06-06 ENCOUNTER — Encounter: Payer: Self-pay | Admitting: Family Medicine

## 2013-06-06 VITALS — BP 176/77 | HR 67 | Wt 104.0 lb

## 2013-06-06 DIAGNOSIS — M899 Disorder of bone, unspecified: Secondary | ICD-10-CM

## 2013-06-06 DIAGNOSIS — M439 Deforming dorsopathy, unspecified: Secondary | ICD-10-CM

## 2013-06-06 DIAGNOSIS — M5137 Other intervertebral disc degeneration, lumbosacral region: Secondary | ICD-10-CM

## 2013-06-06 DIAGNOSIS — IMO0002 Reserved for concepts with insufficient information to code with codable children: Secondary | ICD-10-CM

## 2013-06-06 DIAGNOSIS — E039 Hypothyroidism, unspecified: Secondary | ICD-10-CM

## 2013-06-06 DIAGNOSIS — I10 Essential (primary) hypertension: Secondary | ICD-10-CM

## 2013-06-06 DIAGNOSIS — M40299 Other kyphosis, site unspecified: Secondary | ICD-10-CM

## 2013-06-06 MED ORDER — LOSARTAN POTASSIUM 50 MG PO TABS: 50.0000 mg | ORAL_TABLET | Freq: Every day | ORAL | Status: DC

## 2013-06-06 MED ORDER — ALENDRONATE SODIUM 70 MG PO TABS: 70.0000 mg | ORAL_TABLET | ORAL | Status: DC

## 2013-06-06 MED ORDER — FUROSEMIDE 40 MG PO TABS: 20.0000 mg | ORAL_TABLET | Freq: Every day | ORAL | Status: DC

## 2013-06-06 MED ORDER — GABAPENTIN 100 MG PO CAPS: 100.0000 mg | ORAL_CAPSULE | Freq: Three times a day (TID) | ORAL | Status: DC

## 2013-06-06 MED ORDER — POTASSIUM CHLORIDE ER 10 MEQ PO CPCR: 10.0000 meq | ORAL_CAPSULE | Freq: Every day | ORAL | Status: DC

## 2013-06-06 MED ORDER — LOVASTATIN 40 MG PO TABS: 40.0000 mg | ORAL_TABLET | ORAL | Status: DC

## 2013-06-06 MED ORDER — LOSARTAN POTASSIUM 25 MG PO TABS: 25.0000 mg | ORAL_TABLET | Freq: Every day | ORAL | Status: DC

## 2013-06-06 MED ORDER — METOPROLOL TARTRATE 25 MG PO TABS: 25.0000 mg | ORAL_TABLET | Freq: Two times a day (BID) | ORAL | Status: DC

## 2013-06-06 MED ORDER — AMIODARONE HCL 200 MG PO TABS: 200.0000 mg | ORAL_TABLET | Freq: Every day | ORAL | Status: DC

## 2013-06-06 MED ORDER — CLOPIDOGREL BISULFATE 75 MG PO TABS: ORAL_TABLET | ORAL | Status: DC

## 2013-06-06 NOTE — Progress Notes (Signed)
  Subjective:    Patient ID: Brooke Cunningham, female    DOB: 1929-07-06, 79 y.o.   MRN: [id number]  HPI HTN - she did discontinue the metoprolol. Pulse is back in the normal range. Her blood pressures quite elevated.  Hypothyroidism-he had just her thyroid medication while she was in the hospital. When I saw her at her followup in August we'll recheck her level and we needed to increase her dose based on the level. TSH was around 10 at that time. New prescription was sent to pharmacy.  Still having low back pain from arthritis.  Says still in mid low back. She is now on gabapentin and has really helped her pain and her sleep and she is eating better.  Was using a walker but now using a cane.   Review of Systems     Objective:   Physical Exam  Constitutional: She is oriented to person, place, and time. She appears well-developed and well-nourished.  HENT:  Head: Normocephalic and atraumatic.  Neck: Neck supple. No thyromegaly present.  Cardiovascular: Normal rate, regular rhythm and normal heart sounds.   Pulmonary/Chest: Effort normal and breath sounds normal.  Lymphadenopathy:    She has no cervical adenopathy.  Neurological: She is alert and oriented to person, place, and time.  Skin: Skin is warm and dry.  Psychiatric: She has a normal mood and affect. Her behavior is normal.          Assessment & Plan:  Hypertension-Increas losartan to 50mg .  followup in 6 weeks to make sure that blood pressures under better control on the increased dose.  Hypothyroidism-we'll recheck TSH today to make sure that the TSH is back in the normal range before we refill her prescription for mail order.  Vertebral fracture/low back pain-will put an order for x-ray. They recommended a followup x-ray to make sure that the fractures are healing well.

## 2013-06-06 NOTE — Patient Instructions (Signed)
Stop the metoprolol.

## 2013-06-07 ENCOUNTER — Other Ambulatory Visit: Payer: Self-pay | Admitting: *Deleted

## 2013-06-07 MED ORDER — SYNTHROID 112 MCG PO TABS: 112.0000 ug | ORAL_TABLET | Freq: Every day | ORAL | Status: DC

## 2013-06-14 ENCOUNTER — Other Ambulatory Visit: Payer: Self-pay | Admitting: *Deleted

## 2013-06-14 MED ORDER — LOSARTAN POTASSIUM 50 MG PO TABS: 50.0000 mg | ORAL_TABLET | Freq: Every day | ORAL | Status: DC

## 2013-07-05 ENCOUNTER — Other Ambulatory Visit: Payer: Self-pay | Admitting: Family Medicine

## 2013-08-08 ENCOUNTER — Other Ambulatory Visit: Payer: Self-pay | Admitting: Family Medicine

## 2013-09-04 ENCOUNTER — Telehealth: Payer: Self-pay | Admitting: *Deleted

## 2013-09-04 DIAGNOSIS — M545 Low back pain, unspecified: Secondary | ICD-10-CM

## 2013-09-04 NOTE — Telephone Encounter (Signed)
Pt husband calls and wants to knowif they can get an order for an Xray of her back because she is not getting any better still hurts and then wants to see a specialists Dr[facility]. Wants xray done at hospital

## 2013-09-04 NOTE — Telephone Encounter (Signed)
Printed order for low back. Did she have her MRI back in July? i thought she saw Dr. Edmon Crapeipton then

## 2013-09-05 NOTE — Telephone Encounter (Signed)
Pt husband notified can come pick xray order. Barry DienesKimberly Gordon, LPN

## 2013-09-15 ENCOUNTER — Other Ambulatory Visit: Payer: Self-pay | Admitting: Family Medicine

## 2013-09-21 ENCOUNTER — Other Ambulatory Visit: Payer: Self-pay | Admitting: Family Medicine

## 2013-09-24 ENCOUNTER — Other Ambulatory Visit: Payer: Self-pay | Admitting: Family Medicine

## 2013-10-10 ENCOUNTER — Ambulatory Visit: Payer: Medicare Other | Admitting: Physician Assistant

## 2013-11-15 ENCOUNTER — Telehealth: Payer: Self-pay | Admitting: *Deleted

## 2013-11-15 DIAGNOSIS — Z96649 Presence of unspecified artificial hip joint: Secondary | ICD-10-CM

## 2013-11-15 NOTE — Telephone Encounter (Signed)
[facility] Dr[facility] Linford ArnoldMetheney.  Brooke CoryMisty Yahmir Sokolov, LPN

## 2013-11-15 NOTE — Telephone Encounter (Signed)
Ok to order home health.  [facility].  Let me knoow if any problems puttin in order. Put in for nursing and PT and OT

## 2013-11-15 NOTE — Telephone Encounter (Signed)
Husband states that pt has just came home from the NH with 3 weeks rehab from partial hip replacement. He states he does need some help such as CNA and some PT. He states last time you sent [facility] out there and is okay with them coming back out. He states she doe have an appt with you on Monday.  Meyer CoryMisty Agamjot Kilgallon, LPN

## 2013-11-16 ENCOUNTER — Telehealth: Payer: Self-pay | Admitting: *Deleted

## 2013-11-16 NOTE — Telephone Encounter (Signed)
Ok to hold off on OT and refr to [facility]

## 2013-11-16 NOTE — Telephone Encounter (Signed)
Brooke Cunningham with [facility] called and states that they received an [facility] and [facility].  They are out of network with her insurance and they called several other HH agencies and the one they found that would take her insurance [facility] but they can not do the OT but [facility].  States if your ok with this they will leave as is but if you would like the patient to have OT then will have to find someone that is in her network. Barry DienesKimberly Jeremiah Curci, LPN

## 2013-11-19 ENCOUNTER — Ambulatory Visit (INDEPENDENT_AMBULATORY_CARE_PROVIDER_SITE_OTHER): Payer: Medicare Other | Admitting: Family Medicine

## 2013-11-19 ENCOUNTER — Encounter: Payer: Self-pay | Admitting: Family Medicine

## 2013-11-19 VITALS — BP 189/83 | HR 64 | Wt 110.0 lb

## 2013-11-19 DIAGNOSIS — I10 Essential (primary) hypertension: Secondary | ICD-10-CM

## 2013-11-19 DIAGNOSIS — M810 Age-related osteoporosis without current pathological fracture: Secondary | ICD-10-CM

## 2013-11-19 DIAGNOSIS — E039 Hypothyroidism, unspecified: Secondary | ICD-10-CM

## 2013-11-19 MED ORDER — LEVOTHYROXINE SODIUM 125 MCG PO TABS: 125.0000 ug | ORAL_TABLET | Freq: Every day | ORAL | Status: DC

## 2013-11-19 MED ORDER — ALENDRONATE SODIUM 70 MG PO TABS: 70.0000 mg | ORAL_TABLET | ORAL | Status: DC

## 2013-11-19 NOTE — Progress Notes (Signed)
   Subjective:    Patient ID: Brooke HurtLaura Hoehn, female    DOB: Dec 23, 1928, 80 y.o.   MRN: [id number]  HPI Here to followup. Unfortunately she fell and fractured her left hip and has had a partial replacement. She was placed in the nursing home from us 3 weeks. She was recently discharged on 11/14/2013. On review of her medication list they did start her on Remeron 7.5 mg at bedtime. Husband says remeron made her go "crazy".   She's also taking an iron supplement daily. Starts home PT tomorrow.  Not taking tylenol. Taking one pain pill in AM and none in the evening.  Says the hip is healing well.  He thinks she is taking iron but not sure.   Hypertension- Pt denies chest pain, SOB, dizziness, or heart palpitations.  Taking meds as directed w/o problems.  Denies medication side effects.  Started on coreg.  BP elevated today but says nurse from Home Health checked it yesterday and BP was great.      Review of Systems     Objective:   Physical Exam  Constitutional: She is oriented to person, place, and time. She appears well-developed and well-nourished.  HENT:  Head: Normocephalic and atraumatic.  Cardiovascular: Normal rate, regular rhythm and normal heart sounds.   Pulmonary/Chest: Effort normal and breath sounds normal.  Musculoskeletal:  2+ left ankle swelling.   Neurological: She is alert and oriented to person, place, and time.  Skin: Skin is warm and dry.  Psychiatric: She has a normal mood and affect. Her behavior is normal.          Assessment & Plan:  Hypertension- Uncontrolled today but normal at home with home health nurse.  Continue current meds. F/u in 3 mo.    Hypothyroid - due to recheck TSH. Her dose was increased to 125 mcg while she was in the hospital in a rehabilitation facility. She's been taking 112 mcg dose since she's been home over the last week or so. I will send over new prescription for the 125 mcg and then she can get the lab in about 2 weeks to  recheck.  Osteoporosis - S/p left hip fracture. Restart fosamax.  Make sure taking calcium with vitamin D.  Will recheck level at f/u. Hip is healing well.

## 2013-11-19 NOTE — Patient Instructions (Signed)
Go to the lab in about 2 weeks to have bloodwork done. I want you to be on the new thyroid pill for at least 2 weeks before you go.

## 2013-11-26 ENCOUNTER — Other Ambulatory Visit: Payer: Self-pay | Admitting: *Deleted

## 2013-11-26 MED ORDER — CARVEDILOL 3.125 MG PO TABS: 3.1250 mg | ORAL_TABLET | Freq: Two times a day (BID) | ORAL | Status: DC

## 2013-12-06 ENCOUNTER — Telehealth: Payer: Self-pay | Admitting: *Deleted

## 2013-12-06 NOTE — Telephone Encounter (Signed)
Husband calls and would ike to get a refill on the Remeron 40mg .  Was started on this while in the nursing center.  [place] [facility] [place] [address]

## 2013-12-06 NOTE — Telephone Encounter (Signed)
No problem. We just need to verify the days. Remeron does not come in 40 mg. Once we verify dose okay to send.

## 2013-12-07 ENCOUNTER — Ambulatory Visit: Payer: Medicare Other | Admitting: Family Medicine

## 2013-12-07 MED ORDER — MIRTAZAPINE 15 MG PO TABS: 7.5000 mg | ORAL_TABLET | Freq: Every day | ORAL | Status: DC

## 2013-12-07 NOTE — Telephone Encounter (Signed)
Rx sent 

## 2013-12-07 NOTE — Telephone Encounter (Signed)
LMOM to return call. Kimberly Gordon, LPN  

## 2013-12-07 NOTE — Telephone Encounter (Signed)
Brooke Cunningham states that  The remeron is 7.5mg .

## 2013-12-13 ENCOUNTER — Telehealth: Payer: Self-pay | Admitting: *Deleted

## 2013-12-13 NOTE — Telephone Encounter (Signed)
He states nurse from [employer] called concerned about the weight loss that Vernona RiegerLaura is having. Husband is asking if you can place her on a medication to increase appetite.  Meyer CoryMisty Ahmad, LPN

## 2013-12-14 NOTE — Telephone Encounter (Signed)
We could consider a medication called megestrol, he does have some risks toe. It can cause swelling so this would have to be monitored. There is also slight increased risk of DVT which is a blood clot in the legs on this medication as well. Definitely make sure getting additional nutritional supplements in between meals such as supplemental drinks like boost or Ensure. Okay to have more sweet or high-fat foods if that is what she likes and is willing to eat. If he would like me to try to call in the megestrol we can.

## 2013-12-17 ENCOUNTER — Other Ambulatory Visit: Payer: Self-pay | Admitting: Family Medicine

## 2013-12-17 NOTE — Telephone Encounter (Signed)
He states with the side affects he doesn't want to try the megestrol. States he will try the foods. Informed him that if they change their mind to let us know and we can send the medication.  Meyer CoryMisty Ahmad, LPN

## 2013-12-26 ENCOUNTER — Encounter: Payer: Self-pay | Admitting: Emergency Medicine

## 2013-12-26 ENCOUNTER — Emergency Department
Admission: EM | Admit: 2013-12-26 | Discharge: 2013-12-26 | Disposition: A | Discharge status: Home / Self Care | Payer: Medicare Other | Source: Home / Self Care | Attending: Family Medicine | Admitting: Family Medicine

## 2013-12-26 ENCOUNTER — Emergency Department (INDEPENDENT_AMBULATORY_CARE_PROVIDER_SITE_OTHER): Payer: Medicare Other

## 2013-12-26 DIAGNOSIS — M11239 Other chondrocalcinosis, unspecified wrist: Secondary | ICD-10-CM

## 2013-12-26 DIAGNOSIS — M11231 Other chondrocalcinosis, right wrist: Secondary | ICD-10-CM

## 2013-12-26 DIAGNOSIS — M2540 Effusion, unspecified joint: Secondary | ICD-10-CM

## 2013-12-26 LAB — POCT CBC W AUTO DIFF (K'VILLE URGENT CARE)

## 2013-12-26 MED ORDER — HYDROCODONE-ACETAMINOPHEN 5-325 MG PO TABS: ORAL_TABLET | ORAL | Status: DC

## 2013-12-26 MED ORDER — PREDNISONE 10 MG PO TABS: ORAL_TABLET | ORAL | Status: DC

## 2013-12-26 NOTE — ED Notes (Signed)
Pt and husband report right wrist/hand pain with redness and swelling x yesterday. No known injury or bite.

## 2013-12-26 NOTE — ED Provider Notes (Signed)
CSN: [id number]     Arrival date & time 12/26/13  1353 History   First MD Initiated Contact with Patient 12/26/13 1419     Chief Complaint  Patient presents with  . Wrist Pain    redness/swelling      HPI Comments: Patient complains of onset of pain in her right wrist yesterday.  She recalls no trauma.  Today her wrist became swollen, and she has difficulty sleeping.  No history of gout.  Patient is a 80 y.o. female presenting with wrist pain. The history is provided by the patient and the spouse.  Wrist Pain This is a new problem. The current episode started yesterday. The problem occurs constantly. The problem has been gradually worsening. Associated symptoms comments: none. Exacerbated by: movement of wrist. Nothing relieves the symptoms. Treatments tried: ice pack. The treatment provided no relief.    Past Medical History  Diagnosis Date  . CVA (cerebral vascular accident) 09/21/2011    Dx 2011?   Marland Kitchen. Essential hypertension, benign 09/21/2011  . CHF (congestive heart failure) 09/14/2012    Echo 09/06/12 w/ EF 50%, mild infer-post segmental LV dysfunction. Mild TR.     Marland Kitchen. Atrial fibrillation 09/04/2012   Past Surgical History  Procedure Laterality Date  . Carotid stent      left  . Femoral artery stent      right   Family History  Problem Relation Age of Onset  . Pancreatic cancer    . Hyperlipidemia Sister   . Hypertension Mother   . Stroke Mother   . Stroke Sister    History  Substance Use Topics  . Smoking status: Never Smoker   . Smokeless tobacco: Not on file  . Alcohol Use: No   OB History   Grav Para Term Preterm Abortions TAB SAB Ect Mult Living                 Review of Systems  All other systems reviewed and are negative.   Allergies  Remeron; Sertraline; Sulfa antibiotics; and Penicillins  Home Medications   Prior to Admission medications   Medication Sig Start Date End Date Taking? Authorizing Provider  alendronate (FOSAMAX) 70 MG tablet Take 1  tablet (70 mg total) by mouth every 7 (seven) days. Take with a full glass of water on an empty stomach. 11/19/13   Agapito Gamesatherine D Metheney, MD  amiodarone (PACERONE) 200 MG tablet Take 1 tablet (200 mg total) by mouth daily. 06/06/13   Agapito Gamesatherine D Metheney, MD  carvedilol (COREG) 3.125 MG tablet Take 1 tablet (3.125 mg total) by mouth 2 (two) times daily with a meal. 11/26/13 11/26/14  Agapito Gamesatherine D Metheney, MD  clopidogrel (PLAVIX) 75 MG tablet Take one tablet by mouth one time daily 06/06/13   Agapito Gamesatherine D Metheney, MD  furosemide (LASIX) 20 MG tablet TAKE 1 TABLET BY MOUTH EVERY DAY 12/17/13   Agapito Gamesatherine D Metheney, MD  furosemide (LASIX) 40 MG tablet Take 0.5 tablets (20 mg total) by mouth daily. 06/06/13   Agapito Gamesatherine D Metheney, MD  gabapentin (NEURONTIN) 100 MG capsule Take 1 capsule (100 mg total) by mouth 3 (three) times daily. 06/06/13   Agapito Gamesatherine D Metheney, MD  levothyroxine (SYNTHROID, LEVOTHROID) 125 MCG tablet Take 1 tablet (125 mcg total) by mouth daily before breakfast. 11/19/13 11/19/14  Agapito Gamesatherine D Metheney, MD  losartan (COZAAR) 50 MG tablet TAKE ONE TABLET BY MOUTH ONCE DAILY 09/24/13   Agapito Gamesatherine D Metheney, MD  lovastatin (MEVACOR) 40 MG tablet Take 1 tablet (40  mg total) by mouth every other day. 06/06/13   Agapito Gamesatherine D Metheney, MD  mirtazapine (REMERON) 15 MG tablet Take 0.5 tablets (7.5 mg total) by mouth at bedtime. 12/07/13   Agapito Gamesatherine D Metheney, MD  oxyCODONE-acetaminophen (PERCOCET/ROXICET) 5-325 MG per tablet Take 1 tablet by mouth every 8 (eight) hours as needed for pain. 02/27/13   Monica Bectonhomas J Thekkekandam, MD  potassium chloride (MICRO-K) 10 MEQ CR capsule TAKE 1 CAPSULE IN THE MORNING 12/17/13   Agapito Gamesatherine D Metheney, MD  sennosides-docusate sodium (SENOKOT-S) 8.6-50 MG tablet Take 1 tablet by mouth. 10/22/13 10/22/14  Historical Provider, MD   BP 175/68  Pulse 65  Temp(Src) 98.1 F (36.7 C) (Oral)  Resp 14  Wt 105 lb (47.628 kg)  SpO2 95% Physical Exam  Nursing note and vitals  reviewed. Constitutional: She is oriented to person, place, and time. She appears well-developed. No distress.  HENT:  Head: Normocephalic.  Eyes: Conjunctivae are normal. Pupils are equal, round, and reactive to light.  Pulmonary/Chest: No respiratory distress.  Musculoskeletal:       Right wrist: She exhibits decreased range of motion, tenderness, bony tenderness and swelling. She exhibits no crepitus, no deformity and no laceration.       Arms: The dorsum of patient's right hand and wrist is diffusely swollen and slightly warm.  The maximal area of tenderness and erythema is over the ulnar styloid.  Distal neurovascular function is intact.   Neurological: She is alert and oriented to person, place, and time.  Skin: Skin is warm and dry.    ED Course  Procedures  none    Labs Reviewed  URIC ACID  POCT CBC W AUTO DIFF ([facility]):  WBC 7.7; LY 17.1; MO 8.5; GR 74.4; Hgb 11.0; Platelets 174     Imaging Review Dg Wrist Complete Right  12/26/2013   CLINICAL DATA:  Posterior wrist pain and swelling.  EXAM: RIGHT WRIST - COMPLETE 3+ VIEW  COMPARISON:  None.  FINDINGS: There is soft tissue swelling about the wrist. No fracture is identified. Chondrocalcinosis of the triangular fibrocartilage is noted. No erosion or notable degenerative change is identified.  IMPRESSION: Soft tissue swelling without underlying acute bony or joint abnormality.  Chondrocalcinosis triangular fibrocartilage.   Electronically Signed   By: Drusilla Kannerhomas  Dalessio M.D.   On: 12/26/2013 15:04     MDM   1. Joint swelling   2. Chondrocalcinosis of right wrist  Note mild anemia (Hgb 11.0).  Previous Hgb 12.2 on 03/04/12   Begin prednisone burst.  Low dose Lortab for pain. Uric acid pending Followup with Family Doctor in 2 days as already scheduled.    Lattie HawStephen A Yuleidy Rappleye, MD 12/26/13 [phone]

## 2013-12-26 NOTE — Discharge Instructions (Signed)
Suggest taking Tylenol daytime for pain.   Pseudogout Pseudogout is similar to gout. It is an arthritis that causes pain, swelling, and inflammation in a joint. This is due to the presence of calcium pyrophosphate crystals in the joint fluid. This is a different type of crystal than the crystals that cause gout. The joint pain can be severe and may last for days. In some cases, it may last much longer and can mimic rheumatoid arthritis or osteoarthritis. CAUSES  The exact cause of the disease is not known. It develops when crystals of calcium pyrophosphate build up in a joint. These crystals cause inflammation that leads to pain and swelling of the joint.  Chances of developing pseudogout increase with age. It often follows a minor injury.  The condition may be passed down from parent to child (hereditary).  Events such as strokes, heart attacks, or surgery may increase the risk of pseudogout.  Pseudogout can be associated with other disorders (hemophilia, ochronosis, amyloidosis, or hormonal disorders).  Pseudogout can be associated with dehydration, especially following surgery or hospitalization. Patients with known pseudogout should stay well hydrated before and after surgery. SYMPTOMS   Intense, constant pain in one joint that seems to come on for no reason.  The joint area may be hot to the touch, red, swollen, and stiff.  Pain may last from several days to a few weeks. It may then disappear. Later, it may start again, possibly in a different joint.  Pseudogout usually affects the knees. It can also affect the wrists, elbows, shoulders, and ankles. DIAGNOSIS  The diagnosis is often suggested by your exam or is suspected when an abnormal buildup of calcium salts (calcifications) are seen in the cartilage of joints on X-rays. The final diagnosis is made when fluid from the joint is examined under a special microscope used to find calcium pyrophosphate crystals. The crystals of  pseudogout and gout may both be present at the same time. TREATMENT  Nonsteroidal anti-inflammatory drugs (NSAIDs), such as naproxen, treat the pain. Identifying the trigger of pseudogout and treating the underlying cause, such as dehydration, is also important. There is no way to remove the crystals themselves. HOME CARE INSTRUCTIONS   Put ice on the sore joint.  Put ice in a plastic bag.  Place a towel between your skin and the bag.  Leave the ice on for 15-20 minutes at a time, 03-04 times a day, for the first 2 days.  Keep your affected joints raised (elevated) when possible to lessen swelling.  Use crutches (non-weight bearing) as needed. Walk without crutches as the pain allows, or as instructed. Gradually, start bearing weight.  Only take over-the-counter or prescription medicines for pain, discomfort, or fever as directed by your caregiver.  Once you recover from the painful attack, exercise regularly to keep your muscle strength. Not using a sore joint will cause the muscles around it to become weak. This may increase pain. Low-impact exercises, such as swimming, bicycling, water aerobics, and walking, may be best. This will give you energy, strengthen your heart, help you control your weight, and improve your well-being.  Maintain a healthy weight so your joints do not need to bear more weight than necessary. SEEK MEDICAL CARE IF:   You have an increase in joint pain not relieved with medicine.  You have a fever.  You have more serious symptoms such as skin rash, diarrhea, vomiting, headache, or other joint pains. FOR MORE INFORMATION  Arthritis Foundation: www.arthritis.[employer] National Institute of Arthritis and  Musculoskeletal and Skin Diseases: www.niams.http://www.myers.net/nih.gov Document Released: 05/01/2004 Document Revised: 11/01/2011 Document Reviewed: 11/21/2009 [facility] Patient Information 2014 [employer] [place].

## 2013-12-27 ENCOUNTER — Other Ambulatory Visit: Payer: Self-pay | Admitting: Family Medicine

## 2013-12-27 LAB — URIC ACID: Uric Acid, Serum: 2.4 mg/dL (ref 2.4–7.0)

## 2013-12-28 ENCOUNTER — Ambulatory Visit (INDEPENDENT_AMBULATORY_CARE_PROVIDER_SITE_OTHER): Payer: Medicare Other | Admitting: Family Medicine

## 2013-12-28 ENCOUNTER — Encounter: Payer: Self-pay | Admitting: Family Medicine

## 2013-12-28 VITALS — BP 148/98 | HR 68 | Wt 107.0 lb

## 2013-12-28 DIAGNOSIS — Z0000 Encounter for general adult medical examination without abnormal findings: Secondary | ICD-10-CM

## 2013-12-28 LAB — BASIC METABOLIC PANEL WITH GFR
BUN: 17 mg/dL (ref 6–23)
CALCIUM: 9.2 mg/dL (ref 8.4–10.5)
CO2: 25 meq/L (ref 19–32)
Chloride: 98 mEq/L (ref 96–112)
Creat: 0.83 mg/dL (ref 0.50–1.10)
GFR, Est African American: 75 mL/min
GFR, Est Non African American: 65 mL/min
GLUCOSE: 107 mg/dL — AB (ref 70–99)
Potassium: 4.1 mEq/L (ref 3.5–5.3)
SODIUM: 134 meq/L — AB (ref 135–145)

## 2013-12-28 LAB — TSH: TSH: 2.477 u[IU]/mL (ref 0.350–4.500)

## 2013-12-28 NOTE — Progress Notes (Signed)
Subjective:    Brooke Cunningham is a 80 y.o. female who presents for Medicare Annual/Subsequent preventive examination.  Preventive Screening-Counseling & Management  Tobacco History  Smoking status  . Never Smoker   Smokeless tobacco  . Not on file     Problems Prior to Visit 1.   Current Problems (verified) Patient Active Problem List   Diagnosis Date Noted  . Chronic hyponatremia 04/17/2013  . Osteoporosis, unspecified 03/14/2013  . Unspecified vitamin D deficiency 03/14/2013  . T12 vertebral compression fracture 02/27/2013  . Right shoulder pain 12/13/2012  . CHF (congestive heart failure) 09/14/2012  . Atrial fibrillation 09/04/2012  . Depression due to dementia 08/18/2012  . Insomnia 08/18/2012  . CAD (coronary artery disease) 03/13/2012  . Hypothyroidism 02/16/2012  . CVA (cerebral vascular accident) 09/21/2011  . Essential hypertension, benign 09/21/2011  . Hypokalemia 09/21/2011  . Chronic back pain 09/21/2011  . Carotid bruit 09/21/2011    Medications Prior to Visit Current Outpatient Prescriptions on File Prior to Visit  Medication Sig Dispense Refill  . alendronate (FOSAMAX) 70 MG tablet Take 1 tablet (70 mg total) by mouth every 7 (seven) days. Take with a full glass of water on an empty stomach.  4 tablet  11  . amiodarone (PACERONE) 200 MG tablet Take 1 tablet (200 mg total) by mouth daily.  90 tablet  2  . carvedilol (COREG) 3.125 MG tablet Take 1 tablet (3.125 mg total) by mouth 2 (two) times daily with a meal.  60 tablet  1  . clopidogrel (PLAVIX) 75 MG tablet TAKE 1 TABLET BY MOUTH DAILY  30 tablet  0  . furosemide (LASIX) 20 MG tablet TAKE 1 TABLET BY MOUTH EVERY DAY  30 tablet  0  . furosemide (LASIX) 40 MG tablet Take 0.5 tablets (20 mg total) by mouth daily.  90 tablet  1  . gabapentin (NEURONTIN) 100 MG capsule Take 1 capsule (100 mg total) by mouth 3 (three) times daily.  270 capsule  3  . HYDROcodone-acetaminophen (NORCO/VICODIN) 5-325 MG per  tablet Take one-half to one tab by mouth Q6hr as needed for pain  10 tablet  0  . levothyroxine (SYNTHROID, LEVOTHROID) 125 MCG tablet Take 1 tablet (125 mcg total) by mouth daily before breakfast.  30 tablet  3  . losartan (COZAAR) 50 MG tablet TAKE ONE TABLET BY MOUTH ONCE DAILY  30 tablet  0  . lovastatin (MEVACOR) 40 MG tablet Take 1 tablet (40 mg total) by mouth every other day.  90 tablet  3  . mirtazapine (REMERON) 15 MG tablet Take 0.5 tablets (7.5 mg total) by mouth at bedtime.  30 tablet  2  . oxyCODONE-acetaminophen (PERCOCET/ROXICET) 5-325 MG per tablet Take 1 tablet by mouth every 8 (eight) hours as needed for pain.  30 tablet  0  . potassium chloride (MICRO-K) 10 MEQ CR capsule TAKE 1 CAPSULE IN THE MORNING  30 capsule  0  . predniSONE (DELTASONE) 10 MG tablet Take one tab by mouth twice daily with food  10 tablet  0  . sennosides-docusate sodium (SENOKOT-S) 8.6-50 MG tablet Take 1 tablet by mouth.       No current facility-administered medications on file prior to visit.    Current Medications (verified) Current Outpatient Prescriptions  Medication Sig Dispense Refill  . alendronate (FOSAMAX) 70 MG tablet Take 1 tablet (70 mg total) by mouth every 7 (seven) days. Take with a full glass of water on an empty stomach.  4 tablet  11  .  amiodarone (PACERONE) 200 MG tablet Take 1 tablet (200 mg total) by mouth daily.  90 tablet  2  . carvedilol (COREG) 3.125 MG tablet Take 1 tablet (3.125 mg total) by mouth 2 (two) times daily with a meal.  60 tablet  1  . clopidogrel (PLAVIX) 75 MG tablet TAKE 1 TABLET BY MOUTH DAILY  30 tablet  0  . furosemide (LASIX) 20 MG tablet TAKE 1 TABLET BY MOUTH EVERY DAY  30 tablet  0  . furosemide (LASIX) 40 MG tablet Take 0.5 tablets (20 mg total) by mouth daily.  90 tablet  1  . gabapentin (NEURONTIN) 100 MG capsule Take 1 capsule (100 mg total) by mouth 3 (three) times daily.  270 capsule  3  . HYDROcodone-acetaminophen (NORCO/VICODIN) 5-325 MG per  tablet Take one-half to one tab by mouth Q6hr as needed for pain  10 tablet  0  . levothyroxine (SYNTHROID, LEVOTHROID) 125 MCG tablet Take 1 tablet (125 mcg total) by mouth daily before breakfast.  30 tablet  3  . losartan (COZAAR) 50 MG tablet TAKE ONE TABLET BY MOUTH ONCE DAILY  30 tablet  0  . lovastatin (MEVACOR) 40 MG tablet Take 1 tablet (40 mg total) by mouth every other day.  90 tablet  3  . mirtazapine (REMERON) 15 MG tablet Take 0.5 tablets (7.5 mg total) by mouth at bedtime.  30 tablet  2  . oxyCODONE-acetaminophen (PERCOCET/ROXICET) 5-325 MG per tablet Take 1 tablet by mouth every 8 (eight) hours as needed for pain.  30 tablet  0  . potassium chloride (MICRO-K) 10 MEQ CR capsule TAKE 1 CAPSULE IN THE MORNING  30 capsule  0  . predniSONE (DELTASONE) 10 MG tablet Take one tab by mouth twice daily with food  10 tablet  0  . sennosides-docusate sodium (SENOKOT-S) 8.6-50 MG tablet Take 1 tablet by mouth.       No current facility-administered medications for this visit.     Allergies (verified) Remeron; Sertraline; Sulfa antibiotics; and Penicillins   PAST HISTORY  Family History Family History  Problem Relation Age of Onset  . Pancreatic cancer    . Hyperlipidemia Sister   . Hypertension Mother   . Stroke Mother   . Stroke Sister     Social History History  Substance Use Topics  . Smoking status: Never Smoker   . Smokeless tobacco: Not on file  . Alcohol Use: No     Are there smokers in your home (other than you)? No  Risk Factors Current exercise habits: walking some in AMs  Dietary issues discussed: None   Cardiac risk factors: advanced age (older than 37 for men, 54 for women), hypertension and sedentary lifestyle.  Depression Screen (Note: if answer to either of the following is "Yes", a more complete depression screening is indicated)   Over the past two weeks, have you felt down, depressed or hopeless? No  Over the past two weeks, have you felt little  interest or pleasure in doing things? Yes  Have you lost interest or pleasure in daily life? No  Do you often feel hopeless? No  Do you cry easily over simple problems? No  Activities of Daily Living In your present state of health, do you have any difficulty performing the following activities?:  Driving? Yes Managing money?  No Feeding yourself? No Getting from bed to chair? No  Climbing a flight of stairs? Yes Preparing food and eating?: No Bathing or showering? Yes Getting dressed: No  Getting to the toilet? No Using the toilet:No Moving around from place to place: No In the past year have you fallen or had a near fall?:Yes   Are you sexually active?  No  Do you have more than one partner?  No  Hearing Difficulties: No Do you often ask people to speak up or repeat themselves? No Do you experience ringing or noises in your ears? No Do you have difficulty understanding soft or whispered voices? No   Do you feel that you have a problem with memory? Yes  Do you often misplace items? Yes  Do you feel safe at home?  Yes  Cognitive Testing  Alert? No  Normal Appearance?Yes  Oriented to person? Yes  Place? Yes   Time? No     Advanced Directives have been discussed with the patient? Yes  List the Names of Other Physician/Practitioners you currently use: 1.    Indicate any recent Medical Services you may have received from other than [facility] providers in the past year (date may be approximate).   There is no immunization history on file for this patient.  Screening Tests Health Maintenance  Topic Date Due  . Colonoscopy  10/10/1980  . Influenza Vaccine  03/23/2014  . Tetanus/tdap  01/21/2021  . Pneumococcal Polysaccharide Vaccine Age 78 And Over  Addressed  . Zostavax  Addressed    All answers were reviewed with the patient and necessary referrals were made:  METHENEY,CATHERINE, MD   12/28/2013   History reviewed: allergies, current medications, past family history,  past medical history, past social history, past surgical history and problem list  Review of Systems A comprehensive review of systems was negative.    Objective:     Vision by Snellen chart: right eye: going for eye exam next week  Body mass index is 17.81 kg/(m^2). BP 160/98  Pulse 68  Wt 107 lb (48.535 kg)  BP 148/98  Pulse 68  Wt 107 lb (48.535 kg)  General Appearance:    Alert, cooperative, no distress, appears stated age  Head:    Normocephalic, without obvious abnormality, atraumatic  Eyes:    PERRL, conjunctiva/corneas clear, EOM's intact, both eyes  Ears:    Normal TM's and external ear canals, both ears  Nose:   Nares normal, septum midline, mucosa normal, no drainage    or sinus tenderness  Throat:   Lips, mucosa, and tongue normal; teeth and gums normal  Neck:   Supple, symmetrical, trachea midline, no adenopathy;    thyroid:  no enlargement/tenderness/nodules; no carotid   bruit or JVD  Back:     Symmetric, no curvature, ROM normal, no CVA tenderness  Lungs:     Clear to auscultation bilaterally, respirations unlabored  Chest Wall:    No tenderness or deformity   Heart:    Regular rate and rhythm, S1 and S2 normal, no murmur, rub   or gallop  Breast Exam:    No tenderness, masses, or nipple abnormality  Abdomen:     Soft, non-tender, bowel sounds active all four quadrants,    no masses, no organomegaly  Genitalia:    Not performed  Rectal:    Not performed.   Extremities:   Extremities normal, atraumatic, no cyanosis or edema  Pulses:   2+ and symmetric all extremities  Skin:   Skin color, texture, turgor normal, no rashes or lesions  Lymph nodes:   Cervical, supraclavicular, and axillary nodes normal  Neurologic:   CNII-XII intact, normal strength, sensation and  reflexes    throughout       Assessment:     Medicare annual Wellness Exam      Plan:     During the course of the visit the patient was educated and counseled about appropriate screening  and preventive services including:    Pneumococcal vaccine  - discussed need for prevnar 13. Handout provided. She wants to thing about it.   Due for labs. Reprinted her labslip from March to go today.   Hypothyroid - due to recheck TSH.   Diet review for nutrition referral? Yes ____  Not Indicated X__   Patient Instructions (the written plan) was given to the patient.  Medicare Attestation I have personally reviewed: The patient's medical and social history Their use of alcohol, tobacco or illicit drugs Their current medications and supplements The patient's functional ability including ADLs,fall risks, home safety risks, cognitive, and hearing and visual impairment Diet and physical activities Evidence for depression or mood disorders  The patient's weight, height, BMI, and visual acuity have been recorded in the chart.  I have made referrals, counseling, and provided education to the patient based on review of the above and I have provided the patient with a written personalized care plan for preventive services.     METHENEY,CATHERINE, MD   12/28/2013

## 2013-12-28 NOTE — Patient Instructions (Signed)
Keep up a regular exercise program and make sure you are eating a healthy diet Try to eat 4 servings of dairy a day, or if you are lactose intolerant take a calcium with vitamin D daily.  Your vaccines are up to date.   

## 2013-12-29 LAB — VITAMIN D 25 HYDROXY (VIT D DEFICIENCY, FRACTURES): Vit D, 25-Hydroxy: 46 ng/mL (ref 30–89)

## 2013-12-30 NOTE — Progress Notes (Signed)
Quick Note:  All labs are normal. ______ 

## 2013-12-31 ENCOUNTER — Telehealth: Payer: Self-pay

## 2013-12-31 DIAGNOSIS — Z96649 Presence of unspecified artificial hip joint: Secondary | ICD-10-CM

## 2013-12-31 DIAGNOSIS — Z5189 Encounter for other specified aftercare: Secondary | ICD-10-CM

## 2013-12-31 NOTE — Telephone Encounter (Signed)
Called and informed pt's husband of recommendations.Deno Etienneonya L Batina Dougan

## 2013-12-31 NOTE — Telephone Encounter (Signed)
Try maybe every other day 20 and then next day 40 and then next day 20, etc

## 2013-12-31 NOTE — Telephone Encounter (Signed)
Michelle PiperGuy, Mrs Sites's husband, called and states she has swelling in her left foot. He reports her Lasix being reduce to 20 mg's daily. He wanted to know if she can go back up to 40 mg's of Lasix. Please advise if you need her to come in for an appointment.

## 2014-01-11 ENCOUNTER — Other Ambulatory Visit: Payer: Self-pay | Admitting: Family Medicine

## 2014-01-12 ENCOUNTER — Other Ambulatory Visit: Payer: Self-pay | Admitting: Family Medicine

## 2014-01-21 ENCOUNTER — Encounter: Payer: Self-pay | Admitting: Family Medicine

## 2014-01-21 ENCOUNTER — Ambulatory Visit (INDEPENDENT_AMBULATORY_CARE_PROVIDER_SITE_OTHER): Payer: Medicare Other | Admitting: Family Medicine

## 2014-01-21 VITALS — BP 146/69 | HR 60 | Wt 107.0 lb

## 2014-01-21 DIAGNOSIS — R600 Localized edema: Secondary | ICD-10-CM

## 2014-01-21 DIAGNOSIS — I878 Other specified disorders of veins: Secondary | ICD-10-CM

## 2014-01-21 DIAGNOSIS — I340 Nonrheumatic mitral (valve) insufficiency: Secondary | ICD-10-CM

## 2014-01-21 DIAGNOSIS — I509 Heart failure, unspecified: Secondary | ICD-10-CM

## 2014-01-21 DIAGNOSIS — R609 Edema, unspecified: Secondary | ICD-10-CM

## 2014-01-21 DIAGNOSIS — I872 Venous insufficiency (chronic) (peripheral): Secondary | ICD-10-CM

## 2014-01-21 DIAGNOSIS — I059 Rheumatic mitral valve disease, unspecified: Secondary | ICD-10-CM

## 2014-01-21 MED ORDER — AMBULATORY NON FORMULARY MEDICATION: Status: AC

## 2014-01-21 NOTE — Progress Notes (Signed)
Subjective:    Patient ID: Brooke Cunningham, female    DOB: 10-26-1928, 80 y.o.   MRN: [id number]  HPI F/U LE swelling. She still has persistent swelling of both lower extremity. It's worse on the left compared to the right. A calcified she had a hip replacement on. She does have a diagnosis of congestive heart failure. We had recently increased her Lasix to 40 mg every other day with 20 every other day. Over the last couple weeks her husband has been giving her 40 mg daily. He really hasn't noticed a big difference in the swelling. Seems to be stable. Her weight is stable from a month ago. No major fluctuations there. She has been trying to watch her salt. She denies any chest pain or shortness of breath. The swelling does get better first thing in the morning and when she elevates her feet.   Review of Systems     BP 146/69  Pulse 60  Wt 107 lb (48.535 kg)  SpO2 97%    Allergies  Allergen Reactions  . Remeron [Mirtazapine] Other (See Comments)    Mental status change.   . Sertraline Other (See Comments)    SIADH  . Sulfa Antibiotics Other (See Comments)  . Penicillins Rash    Past Medical History  Diagnosis Date  . CVA (cerebral vascular accident) 09/21/2011    Dx 2011?   Marland Kitchen. Essential hypertension, benign 09/21/2011  . CHF (congestive heart failure) 09/14/2012    Echo 09/06/12 w/ EF 50%, mild infer-post segmental LV dysfunction. Mild TR.     Marland Kitchen. Atrial fibrillation 09/04/2012    Past Surgical History  Procedure Laterality Date  . Carotid stent      left  . Femoral artery stent      right    History   Social History  . Marital Status: Married    Spouse Name: Michelle PiperGuy    Number of Children: N/A  . Years of Education: N/A   Occupational History  . Not on file.   Social History Main Topics  . Smoking status: Never Smoker   . Smokeless tobacco: Not on file  . Alcohol Use: No  . Drug Use: No  . Sexual Activity: Not Currently   Other Topics Concern  . Not on file    Social History Narrative   2 caffeine drinks per day.  Walks for exercise.     Family History  Problem Relation Age of Onset  . Pancreatic cancer    . Hyperlipidemia Sister   . Hypertension Mother   . Stroke Mother   . Stroke Sister     Outpatient Encounter Prescriptions as of 01/21/2014  Medication Sig  . alendronate (FOSAMAX) 70 MG tablet Take 1 tablet (70 mg total) by mouth every 7 (seven) days. Take with a full glass of water on an empty stomach.  Marland Kitchen. amiodarone (PACERONE) 200 MG tablet Take 1 tablet (200 mg total) by mouth daily.  . carvedilol (COREG) 3.125 MG tablet Take 1 tablet (3.125 mg total) by mouth 2 (two) times daily with a meal.  . clopidogrel (PLAVIX) 75 MG tablet TAKE 1 TABLET BY MOUTH DAILY  . furosemide (LASIX) 40 MG tablet TAKE 1 TABLET BY MOUTH EVERY MORNING  . gabapentin (NEURONTIN) 100 MG capsule Take 1 capsule (100 mg total) by mouth 3 (three) times daily.  Marland Kitchen. HYDROcodone-acetaminophen (NORCO/VICODIN) 5-325 MG per tablet Take one-half to one tab by mouth Q6hr as needed for pain  . levothyroxine (SYNTHROID, LEVOTHROID) 125 MCG  tablet Take 1 tablet (125 mcg total) by mouth daily before breakfast.  . losartan (COZAAR) 50 MG tablet TAKE ONE TABLET BY MOUTH ONCE DAILY  . lovastatin (MEVACOR) 40 MG tablet Take 1 tablet (40 mg total) by mouth every other day.  . mirtazapine (REMERON) 15 MG tablet Take 0.5 tablets (7.5 mg total) by mouth at bedtime.  . potassium chloride (MICRO-K) 10 MEQ CR capsule TAKE 1 CAPSULE IN THE MORNING  . sennosides-docusate sodium (SENOKOT-S) 8.6-50 MG tablet Take 1 tablet by mouth.  . AMBULATORY NON FORMULARY MEDICATION Medication Name: compression stockings. Dx Venous stasis and lower extremity edema  . [DISCONTINUED] furosemide (LASIX) 20 MG tablet TAKE 1 TABLET BY MOUTH EVERY DAY  . [DISCONTINUED] levothyroxine (SYNTHROID, LEVOTHROID) 112 MCG tablet TAKE 1 TABLET BY MOUTH EVERY DAY       Objective:   Physical Exam  Constitutional:  She is oriented to person, place, and time. She appears well-developed and well-nourished.  HENT:  Head: Normocephalic and atraumatic.  Cardiovascular: Normal rate, regular rhythm and normal heart sounds.   Pulmonary/Chest: Effort normal and breath sounds normal.  Musculoskeletal: She exhibits edema.  2+ pitting edema around the left ankle and 1+ pitting edema of the right ankle. No tibial edema.  Neurological: She is alert and oriented to person, place, and time.  Skin: Skin is warm and dry.  Psychiatric: She has a normal mood and affect. Her behavior is normal.          Assessment & Plan:  Lower extremity swelling-most consistent with deep tendon edema-that she's not having a good response to the Lasix. Recommend compression stockings. Prescription given today. She can certainly start with light grade stockings. I really don't think she would be able to get moderate grade compression stockings on without significant difficulty. I do think this would at least help. We'll continue Lasix 40 mg and recheck BUN creatinine today to make sure that it's stable. I definitely recommend a low salt diet. Some solid is okay, especially since she does have problems with hyponatremia but in moderation. We also recently checked her thyroid about a month ago and it was well-controlled.  Congestive heart failure-her weight is stable, her swelling is stable, and she is asymptomatic with no chest pain or shortness of breath. I did review her echocardiogram performed at [facility] showing severe mitral regurgitation. Blood pressure is well-controlled today. On ARB, Lasix

## 2014-01-22 ENCOUNTER — Other Ambulatory Visit: Payer: Self-pay | Admitting: Family Medicine

## 2014-01-31 ENCOUNTER — Other Ambulatory Visit: Payer: Self-pay | Admitting: Family Medicine

## 2014-02-04 ENCOUNTER — Other Ambulatory Visit: Payer: Self-pay | Admitting: Family Medicine

## 2014-02-19 ENCOUNTER — Ambulatory Visit: Payer: Medicare Other | Admitting: Sports Medicine

## 2014-02-25 ENCOUNTER — Other Ambulatory Visit: Payer: Self-pay | Admitting: Family Medicine

## 2014-03-04 ENCOUNTER — Other Ambulatory Visit: Payer: Self-pay | Admitting: Family Medicine

## 2014-03-04 MED ORDER — CARVEDILOL 3.125 MG PO TABS: ORAL_TABLET | ORAL | Status: DC

## 2014-03-13 ENCOUNTER — Encounter: Payer: Self-pay | Admitting: Family Medicine

## 2014-03-13 ENCOUNTER — Ambulatory Visit (INDEPENDENT_AMBULATORY_CARE_PROVIDER_SITE_OTHER): Payer: Medicare Other | Admitting: Family Medicine

## 2014-03-13 VITALS — BP 152/69 | HR 60 | Wt 104.0 lb

## 2014-03-13 DIAGNOSIS — M810 Age-related osteoporosis without current pathological fracture: Secondary | ICD-10-CM

## 2014-03-13 DIAGNOSIS — I509 Heart failure, unspecified: Secondary | ICD-10-CM

## 2014-03-13 DIAGNOSIS — I4891 Unspecified atrial fibrillation: Secondary | ICD-10-CM

## 2014-03-13 DIAGNOSIS — R7309 Other abnormal glucose: Secondary | ICD-10-CM

## 2014-03-13 DIAGNOSIS — I10 Essential (primary) hypertension: Secondary | ICD-10-CM

## 2014-03-13 DIAGNOSIS — I2510 Atherosclerotic heart disease of native coronary artery without angina pectoris: Secondary | ICD-10-CM

## 2014-03-13 DIAGNOSIS — I482 Chronic atrial fibrillation, unspecified: Secondary | ICD-10-CM

## 2014-03-13 LAB — POCT GLYCOSYLATED HEMOGLOBIN (HGB A1C): Hemoglobin A1C: 5.2

## 2014-03-13 MED ORDER — FUROSEMIDE 40 MG PO TABS: ORAL_TABLET | ORAL | Status: DC

## 2014-03-13 MED ORDER — POTASSIUM CHLORIDE ER 10 MEQ PO CPCR: ORAL_CAPSULE | ORAL | Status: DC

## 2014-03-13 MED ORDER — MIRTAZAPINE 15 MG PO TABS: 7.5000 mg | ORAL_TABLET | Freq: Every day | ORAL | Status: DC

## 2014-03-13 MED ORDER — LOSARTAN POTASSIUM 50 MG PO TABS: ORAL_TABLET | ORAL | Status: DC

## 2014-03-13 MED ORDER — CLOPIDOGREL BISULFATE 75 MG PO TABS: ORAL_TABLET | ORAL | Status: DC

## 2014-03-13 MED ORDER — GABAPENTIN 100 MG PO CAPS: 100.0000 mg | ORAL_CAPSULE | Freq: Every day | ORAL | Status: DC

## 2014-03-13 MED ORDER — CARVEDILOL 3.125 MG PO TABS: ORAL_TABLET | ORAL | Status: DC

## 2014-03-13 MED ORDER — LEVOTHYROXINE SODIUM 125 MCG PO TABS: 125.0000 ug | ORAL_TABLET | Freq: Every day | ORAL | Status: DC

## 2014-03-13 MED ORDER — ALENDRONATE SODIUM 70 MG PO TABS: 70.0000 mg | ORAL_TABLET | ORAL | Status: DC

## 2014-03-13 MED ORDER — LOVASTATIN 40 MG PO TABS: ORAL_TABLET | ORAL | Status: DC

## 2014-03-13 NOTE — Progress Notes (Signed)
   Subjective:    Patient ID: Brooke Cunningham, female    DOB: [name], 80 y.o.   MRN: [id number]  HPI Hypertension- Pt denies chest pain, SOB, dizziness, or heart palpitations.  Taking meds as directed w/o problems.  Denies medication side effects.  There has been has been noticing her blood pressures been gradually increasing. No lower extremity edema or swelling. Her has been requested 90 day supplies on her medications.  CAD/CHF- her cardiologist to get a copy of her last cardiac cath as well as her last echocardiogram.  Atrial fibrillation-no lightheadedness or dizziness. No chest pain or palpitations. She actually takes half of a tab of amiodarone. Her bottle says 1 tablet she says she was instructed to take a half. She seems to be well-controlled on this regimen.  Osteoporosis-she was started on Fosamax after a femur fracture. Do not have a bone density test from the last 5 years. Review of Systems     Objective:   Physical Exam  Constitutional: She is oriented to person, place, and time. She appears well-developed and well-nourished.  HENT:  Head: Normocephalic and atraumatic.  Cardiovascular: Normal rate, regular rhythm and normal heart sounds.   Pulmonary/Chest: Effort normal and breath sounds normal.  Neurological: She is alert and oriented to person, place, and time.  Skin: Skin is warm and dry.  Psychiatric: She has a normal mood and affect. Her behavior is normal.          Assessment & Plan:  HTN- slightly elevated today. No sign of volume overload. In fact, her weight is down about 3 pounds today. We'll increase losartan to 50 mg. Followup in 6 weeks for repeat blood pressure check. It's also been a year since she saw her cardiologist I encouraged her to go ahead and try to schedule a followup appointment with him.  CAD/CHF- [facility]. I did call to get a copy of her last echocardiogram which was performed  in January of 2014. It showed an EF of 50% and some mild tricuspid regurg as well as mild inferior posterior segment LV dysfunction. She also had a cardiac cath performed in June of 2013 which showed acute inferior MI, mild left ventricle dysfunction and single-vessel coronary artery disease RCA  Abnormal glucose - has had an abnormal glucose on last 2 sets of labs that she was not fasting. We'll do an A1c today just to make sure no sign of impaired fasting glucose.  Atrial fibrillation-well-controlled on current regimen. Do encourage her to make sure she keeps her yearly followup with her cardiologist.  Osteoporosis-will schedule for bone density test.

## 2014-03-19 ENCOUNTER — Other Ambulatory Visit: Payer: Medicare Other

## 2014-05-07 ENCOUNTER — Ambulatory Visit (INDEPENDENT_AMBULATORY_CARE_PROVIDER_SITE_OTHER): Payer: Medicare Other | Admitting: Family Medicine

## 2014-05-07 ENCOUNTER — Encounter: Payer: Self-pay | Admitting: Family Medicine

## 2014-05-07 VITALS — BP 148/82 | HR 61 | Wt 106.0 lb

## 2014-05-07 DIAGNOSIS — I482 Chronic atrial fibrillation, unspecified: Secondary | ICD-10-CM

## 2014-05-07 DIAGNOSIS — M810 Age-related osteoporosis without current pathological fracture: Secondary | ICD-10-CM

## 2014-05-07 DIAGNOSIS — I10 Essential (primary) hypertension: Secondary | ICD-10-CM

## 2014-05-07 DIAGNOSIS — I4891 Unspecified atrial fibrillation: Secondary | ICD-10-CM

## 2014-05-07 NOTE — Progress Notes (Signed)
   Subjective:    Patient ID: Brooke Cunningham, female    DOB: [birth year], 80 y.o.   MRN: [id number]  Hypertension   HTN f/u - we inc her losartan to  daily. She is tolreating well with no S.E. no chest pain or short of breath. No change in swelling. She has chronic swelling of the left ankle.  Osteoporosis - She says doesn't remember it was called abougt scheduling her bone density.   Atrial fibrillation-she feels like her weight is really well controlled on half of a tab of amiodarone.  Review of Systems     Objective:   Physical Exam  Constitutional: She is oriented to person, place, and time. She appears well-developed and well-nourished.  HENT:  Head: Normocephalic and atraumatic.  Cardiovascular: Normal rate, regular rhythm and normal heart sounds.   Pulmonary/Chest: Effort normal and breath sounds normal.  Neurological: She is alert and oriented to person, place, and time.  Skin: Skin is warm and dry.  Psychiatric: She has a normal mood and affect. Her behavior is normal.          Assessment & Plan:  Flu vaccine declined says will get at [facility].    Hypertension-much improved today. At goal with a systolic under 150 based on her age. We'll continue current regimen. They have not made an appointment with cardiology as we discussed previously. Followup in 4 months.  Osteoporosis-due for repeat bone density. I will have them call her again to schedule. Continue Fosamax.  Atrial fibrillation-doing well on her amiodarone. She's also on Lasix 40 mg daily carvedilol and Plavix.

## 2014-05-12 ENCOUNTER — Other Ambulatory Visit: Payer: Self-pay | Admitting: Family Medicine

## 2014-05-29 ENCOUNTER — Other Ambulatory Visit: Payer: Self-pay | Admitting: Family Medicine

## 2014-06-12 ENCOUNTER — Ambulatory Visit: Payer: Medicare Other

## 2014-07-02 ENCOUNTER — Ambulatory Visit: Payer: Medicare Other | Admitting: Family Medicine

## 2014-07-03 ENCOUNTER — Other Ambulatory Visit: Payer: Self-pay | Admitting: Family Medicine

## 2014-08-01 ENCOUNTER — Other Ambulatory Visit: Payer: Self-pay | Admitting: Family Medicine

## 2014-08-07 ENCOUNTER — Ambulatory Visit (INDEPENDENT_AMBULATORY_CARE_PROVIDER_SITE_OTHER): Payer: Medicare Other | Admitting: Family Medicine

## 2014-08-07 ENCOUNTER — Encounter: Payer: Self-pay | Admitting: Family Medicine

## 2014-08-07 VITALS — BP 169/77 | HR 60 | Temp 98.0°F | Wt 108.0 lb

## 2014-08-07 DIAGNOSIS — R05 Cough: Secondary | ICD-10-CM

## 2014-08-07 DIAGNOSIS — R059 Cough, unspecified: Secondary | ICD-10-CM

## 2014-08-07 NOTE — Progress Notes (Signed)
   Subjective:    Patient ID: Brooke Cunningham, female    DOB: 08/11/1929, 80 y.o.   MRN: [id number]  HPI Dry cough x 1 week.  No SOB or CP. No wheezing. No postnasal drip.  No fever, chills, sweats.  No sick contact. No sinus sxs. No heartburn. No GI sxs.    Review of Systems     Objective:   Physical Exam  Constitutional: She is oriented to person, place, and time. She appears well-developed and well-nourished.  HENT:  Head: Normocephalic and atraumatic.  Right Ear: External ear normal.  Left Ear: External ear normal.  Nose: Nose normal.  Mouth/Throat: Oropharynx is clear and moist.  TMs and canals are clear.   Eyes: Conjunctivae and EOM are normal. Pupils are equal, round, and reactive to light.  Neck: Neck supple. No thyromegaly present.  Cardiovascular: Normal rate, regular rhythm and normal heart sounds.   Pulmonary/Chest: Effort normal and breath sounds normal. She has no wheezes.  Lymphadenopathy:    She has no cervical adenopathy.  Neurological: She is alert and oriented to person, place, and time.  Skin: Skin is warm and dry.  Psychiatric: She has a normal mood and affect.          Assessment & Plan:  Dry cough - likley viral. No GERD sxs. Call if sxs worsen, develops fever or just doesn't improve. Lung exam is clear today.  Pulse ox is normal.  No LNs. Recommend stay well-hydrated. Run humidifier, and can use over-the-counter cough drops. Also recommend honey which is soothing for the throat and can help with cough.

## 2014-08-29 ENCOUNTER — Other Ambulatory Visit: Payer: Self-pay | Admitting: *Deleted

## 2014-08-29 MED ORDER — CLOPIDOGREL BISULFATE 75 MG PO TABS: ORAL_TABLET | ORAL | Status: AC

## 2014-09-06 ENCOUNTER — Ambulatory Visit: Payer: Medicare Other | Admitting: Family Medicine

## 2014-09-12 ENCOUNTER — Ambulatory Visit: Payer: Medicare Other | Admitting: Family Medicine

## 2014-09-24 ENCOUNTER — Ambulatory Visit (INDEPENDENT_AMBULATORY_CARE_PROVIDER_SITE_OTHER): Payer: Commercial Managed Care - HMO | Admitting: Family Medicine

## 2014-09-24 ENCOUNTER — Encounter: Payer: Self-pay | Admitting: Family Medicine

## 2014-09-24 VITALS — BP 152/88 | HR 65 | Wt 110.0 lb

## 2014-09-24 DIAGNOSIS — E038 Other specified hypothyroidism: Secondary | ICD-10-CM

## 2014-09-24 DIAGNOSIS — I509 Heart failure, unspecified: Secondary | ICD-10-CM

## 2014-09-24 DIAGNOSIS — I10 Essential (primary) hypertension: Secondary | ICD-10-CM

## 2014-09-24 MED ORDER — FUROSEMIDE 40 MG PO TABS: ORAL_TABLET | ORAL | Status: AC

## 2014-09-24 MED ORDER — LEVOTHYROXINE SODIUM 125 MCG PO TABS: ORAL_TABLET | ORAL | Status: AC

## 2014-09-24 MED ORDER — CARVEDILOL 3.125 MG PO TABS: ORAL_TABLET | ORAL | Status: AC

## 2014-09-24 MED ORDER — POTASSIUM CHLORIDE ER 10 MEQ PO CPCR: ORAL_CAPSULE | ORAL | Status: AC

## 2014-09-24 MED ORDER — LOSARTAN POTASSIUM 50 MG PO TABS: ORAL_TABLET | ORAL | Status: AC

## 2014-09-24 NOTE — Progress Notes (Signed)
Subjective:    Patient ID: Brooke Cunningham, female    DOB: 1929/06/18, 81 y.o.   MRN: [id number]  HPI Hypertension- Pt denies chest pain, SOB, dizziness, or heart palpitations.  Taking meds as directed w/o problems.  Denies medication side effects.  Has been eating out more often. Her husband says that she can see better if he takes her out to eat and then if she eats at home.  CHF- No CP, SOB or swelling. Very inactive. Her husband is here with her today.   Hypothyroid - No skin or hair changes. No sig weight changes.  She has been taking her medication regularly.    Review of Systems  BP 152/88 mmHg  Pulse 65  Wt 110 lb (49.896 kg)  SpO2 98%    Allergies  Allergen Reactions  . Remeron [Mirtazapine] Other (See Comments)    Mental status change.   . Sertraline Other (See Comments)    SIADH  . Sulfa Antibiotics Other (See Comments)  . Penicillins Rash    Past Medical History  Diagnosis Date  . CVA (cerebral vascular accident) 09/21/2011    Dx 2011?   Marland Kitchen. Essential hypertension, benign 09/21/2011  . CHF (congestive heart failure) 09/14/2012    Echo 09/06/12 w/ EF 50%, mild infer-post segmental LV dysfunction. Mild TR.     Marland Kitchen. Atrial fibrillation 09/04/2012    Past Surgical History  Procedure Laterality Date  . Carotid stent      left  . Femoral artery stent      right    History   Social History  . Marital Status: Married    Spouse Name: Michelle PiperGuy    Number of Children: N/A  . Years of Education: N/A   Occupational History  . Not on file.   Social History Main Topics  . Smoking status: Never Smoker   . Smokeless tobacco: Not on file  . Alcohol Use: No  . Drug Use: No  . Sexual Activity: Not Currently   Other Topics Concern  . Not on file   Social History Narrative   2 caffeine drinks per day.  Walks for exercise.     Family History  Problem Relation Age of Onset  . Pancreatic cancer    . Hyperlipidemia Sister   . Hypertension Mother   . Stroke Mother   .  Stroke Sister     Outpatient Encounter Prescriptions as of 09/24/2014  Medication Sig  . alendronate (FOSAMAX) 70 MG tablet Take 1 tablet (70 mg total) by mouth every 7 (seven) days. Take with a full glass of water on an empty stomach.  . AMBULATORY NON FORMULARY MEDICATION Medication Name: compression stockings. Dx Venous stasis and lower extremity edema  . amiodarone (PACERONE) 200 MG tablet Take 1 tablet (200 mg total) by mouth daily. (Patient taking differently: Take 200 mg by mouth daily. 1/2 tab daily)  . carvedilol (COREG) 3.125 MG tablet TAKE 1 TABLET BY MOUTH TWICE DAILY WITH A MEAL  . clopidogrel (PLAVIX) 75 MG tablet TAKE 1 TABLET BY MOUTH EVERY DAY  . furosemide (LASIX) 40 MG tablet TAKE 1 TABLET BY MOUTH EVERY MORNING  . gabapentin (NEURONTIN) 100 MG capsule Take 1 capsule (100 mg total) by mouth at bedtime.  Marland Kitchen. levothyroxine (SYNTHROID, LEVOTHROID) 125 MCG tablet TAKE 1 TABLET BY MOUTH EVERY DAY BEFORE BREAKFAST  . losartan (COZAAR) 50 MG tablet TAKE ONE TABLET BY MOUTH ONCE DAILY  . lovastatin (MEVACOR) 40 MG tablet TAKE 1 TABLET BY MOUTH EVERY OTHER  DAY  . potassium chloride (MICRO-K) 10 MEQ CR capsule TAKE ONE CAPSULE BY MOUTH EVERY MORNING  . sennosides-docusate sodium (SENOKOT-S) 8.6-50 MG tablet Take 1 tablet by mouth.  . [DISCONTINUED] carvedilol (COREG) 3.125 MG tablet TAKE 1 TABLET BY MOUTH TWICE DAILY WITH A MEAL  . [DISCONTINUED] furosemide (LASIX) 40 MG tablet TAKE 1 TABLET BY MOUTH EVERY MORNING  . [DISCONTINUED] levothyroxine (SYNTHROID, LEVOTHROID) 125 MCG tablet TAKE 1 TABLET BY MOUTH EVERY DAY BEFORE BREAKFAST  . [DISCONTINUED] losartan (COZAAR) 50 MG tablet TAKE ONE TABLET BY MOUTH ONCE DAILY  . [DISCONTINUED] potassium chloride (MICRO-K) 10 MEQ CR capsule TAKE ONE CAPSULE BY MOUTH EVERY MORNING  . [DISCONTINUED] mirtazapine (REMERON) 15 MG tablet Take 0.5 tablets (7.5 mg total) by mouth at bedtime.          Objective:   Physical Exam  Constitutional: She  is oriented to person, place, and time. She appears well-developed and well-nourished.  HENT:  Head: Normocephalic and atraumatic.  Cardiovascular: Normal rate, regular rhythm and normal heart sounds.   Pulmonary/Chest: Effort normal and breath sounds normal.  Neurological: She is alert and oriented to person, place, and time.  Skin: Skin is warm and dry.  Psychiatric: She has a normal mood and affect. Her behavior is normal.          Assessment & Plan:  HTN -  Elevated today. He has been has ordered a new blood pressure cuff. He hasn't gotten it in yet. Encouraged him to start checking blood pressures at home once he gets it in and let me know if the blood pressures are still staying elevated.  CHF -  stable. No recent exacerbations. Continue current regimen. Due for CMP and fasting lipid panel. They will go later this week. Follow-up in 4 months.  Hypothyroid. -Due to recheck TSH.

## 2014-09-30 ENCOUNTER — Ambulatory Visit: Payer: Self-pay | Admitting: Family Medicine

## 2014-10-01 LAB — COMPLETE METABOLIC PANEL WITH GFR
ALK PHOS: 65 U/L (ref 39–117)
ALT: 11 U/L (ref 0–35)
AST: 20 U/L (ref 0–37)
Albumin: 3.7 g/dL (ref 3.5–5.2)
BUN: 11 mg/dL (ref 6–23)
CO2: 30 meq/L (ref 19–32)
Calcium: 8.8 mg/dL (ref 8.4–10.5)
Chloride: 101 mEq/L (ref 96–112)
Creat: 0.8 mg/dL (ref 0.50–1.10)
GFR, EST NON AFRICAN AMERICAN: 68 mL/min
GFR, Est African American: 79 mL/min
Glucose, Bld: 83 mg/dL (ref 70–99)
Potassium: 3.5 mEq/L (ref 3.5–5.3)
Sodium: 140 mEq/L (ref 135–145)
Total Bilirubin: 0.7 mg/dL (ref 0.2–1.2)
Total Protein: 6.4 g/dL (ref 6.0–8.3)

## 2014-10-01 LAB — TSH: TSH: 0.989 u[IU]/mL (ref 0.350–4.500)

## 2014-10-01 LAB — LIPID PANEL
CHOLESTEROL: 173 mg/dL (ref 0–200)
HDL: 69 mg/dL (ref >39)
LDL Cholesterol: 90 mg/dL (ref 0–99)
Total CHOL/HDL Ratio: 2.5 Ratio
Triglycerides: 68 mg/dL (ref <150)
VLDL: 14 mg/dL (ref 0–40)

## 2014-10-01 NOTE — Progress Notes (Signed)
Quick Note:  All labs are normal. ______ 

## 2014-10-08 ENCOUNTER — Other Ambulatory Visit: Payer: Self-pay | Admitting: Family Medicine

## 2014-10-09 ENCOUNTER — Other Ambulatory Visit: Payer: Self-pay

## 2014-10-09 MED ORDER — GABAPENTIN 100 MG PO CAPS: 100.0000 mg | ORAL_CAPSULE | Freq: Every day | ORAL | Status: AC

## 2014-10-09 MED ORDER — LOVASTATIN 40 MG PO TABS: ORAL_TABLET | ORAL | Status: AC

## 2014-10-09 MED ORDER — ALENDRONATE SODIUM 70 MG PO TABS: 70.0000 mg | ORAL_TABLET | ORAL | Status: AC

## 2014-10-09 MED ORDER — AMIODARONE HCL 200 MG PO TABS: 200.0000 mg | ORAL_TABLET | Freq: Every day | ORAL | Status: AC

## 2014-11-21 ENCOUNTER — Telehealth: Payer: Self-pay | Admitting: *Deleted

## 2014-11-21 NOTE — Telephone Encounter (Signed)
Pt's son called and wanted to get a med list faxed to him at [phone]. Faxed over med list to fax num listed

## 2014-12-02 ENCOUNTER — Encounter: Payer: Self-pay | Admitting: Physician Assistant

## 2014-12-02 ENCOUNTER — Telehealth: Payer: Self-pay | Admitting: Physician Assistant

## 2014-12-02 ENCOUNTER — Ambulatory Visit (INDEPENDENT_AMBULATORY_CARE_PROVIDER_SITE_OTHER): Payer: Commercial Managed Care - HMO | Admitting: Physician Assistant

## 2014-12-02 ENCOUNTER — Ambulatory Visit (INDEPENDENT_AMBULATORY_CARE_PROVIDER_SITE_OTHER): Payer: Commercial Managed Care - HMO

## 2014-12-02 VITALS — BP 124/72 | HR 76 | Wt 111.0 lb

## 2014-12-02 DIAGNOSIS — R079 Chest pain, unspecified: Secondary | ICD-10-CM

## 2014-12-02 DIAGNOSIS — G479 Sleep disorder, unspecified: Secondary | ICD-10-CM

## 2014-12-02 DIAGNOSIS — R071 Chest pain on breathing: Secondary | ICD-10-CM

## 2014-12-02 DIAGNOSIS — R05 Cough: Secondary | ICD-10-CM

## 2014-12-02 DIAGNOSIS — I517 Cardiomegaly: Secondary | ICD-10-CM

## 2014-12-02 DIAGNOSIS — R059 Cough, unspecified: Secondary | ICD-10-CM

## 2014-12-02 MED ORDER — BENZONATATE 200 MG PO CAPS: 200.0000 mg | ORAL_CAPSULE | Freq: Two times a day (BID) | ORAL | Status: DC | PRN

## 2014-12-02 NOTE — Telephone Encounter (Signed)
Will you call pt- CXR was negative for pneumonia or any acute process. There was a severe compression fracture of T12 that seems to be worsen. When she picked up the dog and now cough she is likely pressing down on this and causing pain. She could certainly follow up with her orthopedist for compression fracture.

## 2014-12-02 NOTE — Patient Instructions (Addendum)
Tessalon pearles for cough as needed.  mucinex regular NO D.  Honey cough preparation for soothing cough.  (Delsym)- for cough  Melatonin for sleep 3mg  up to 10mg .

## 2014-12-03 ENCOUNTER — Telehealth: Payer: Self-pay | Admitting: *Deleted

## 2014-12-03 DIAGNOSIS — G479 Sleep disorder, unspecified: Secondary | ICD-10-CM | POA: Insufficient documentation

## 2014-12-03 NOTE — Progress Notes (Signed)
   Subjective:    Patient ID: Brooke Cunningham, female    DOB: [birth year], 81 y.o.   MRN: [id number]  HPI  Pt is a 81 yo female who presents to the clinic with her son and daughter in law. She comes in with 1 day of dry cough. Cough hurts around her sternum and into her back every time she coughs. She does admit to picking her up dog and feeling a pull in her chest. She does have hx of compression fractures and soft bones. Not tried anything to make better. No fever, chills, sinus pressure, ear pain, SOB, wheezing or ST.   Pt just lost her husband 1 week ago and having problems sleeping. Not tried anything.     Review of Systems  All other systems reviewed and are negative.      Objective:   Physical Exam  Constitutional: She is oriented to person, place, and time. She appears well-developed and well-nourished.  HENT:  Head: Normocephalic and atraumatic.  Right Ear: External ear normal.  Left Ear: External ear normal.  Nose: Nose normal.  Mouth/Throat: Oropharynx is clear and moist. No oropharyngeal exudate.  Eyes: Conjunctivae are normal. Right eye exhibits no discharge. Left eye exhibits no discharge.  Neck: Normal range of motion. Neck supple.  Cardiovascular: Normal rate, regular rhythm and normal heart sounds.   Pulmonary/Chest: Effort normal and breath sounds normal.  Some tenderness to palpation over sternum.   Lymphadenopathy:    She has no cervical adenopathy.  Neurological: She is alert and oriented to person, place, and time.  Skin: Skin is dry.  Psychiatric: She has a normal mood and affect. Her behavior is normal.          Assessment & Plan:  Cough- no signs of infection today. Will get CXR due to pain. CXR was negative. Tessalon pearles given for cough. mucinex to use as expectorant and possible delsym.   Compression fracture T12- will call pt with results of CXR. Discussed potential follow up with Dr. Karie Schwalbe or ortho. Could be cause of some of pain with cough.    Sleeping difficulties- has follow up with metheney in a few weeks. Discussed trying melatonin first up to 10mg . I am hesistant to give benzo due to fall risk. Certainly may consider in the short term at bedtime but would like to run by PCP>

## 2014-12-03 NOTE — Telephone Encounter (Signed)
Patient son Merdis Delay.L. Called looking for results of the CXR.  Spoke with him & notified that :  CXR was negative for pneumonia or any acute process. There was a severe compression fracture of T12 that seems to be worsen. When she picked up the dog and now cough she is likely pressing down on this and causing pain. She could certainly follow up with her orthopedist for compression fracture.

## 2014-12-03 NOTE — Telephone Encounter (Signed)
Daughter-in-law advised and they with call back with the orthopedics name.

## 2014-12-06 NOTE — Telephone Encounter (Signed)
Pt's son called and decided to see Dr. Karie Schwalbe first.

## 2014-12-09 ENCOUNTER — Other Ambulatory Visit: Payer: Self-pay | Admitting: Family Medicine

## 2014-12-12 ENCOUNTER — Ambulatory Visit (INDEPENDENT_AMBULATORY_CARE_PROVIDER_SITE_OTHER): Payer: Commercial Managed Care - HMO | Admitting: Family Medicine

## 2014-12-12 ENCOUNTER — Encounter: Payer: Self-pay | Admitting: Family Medicine

## 2014-12-12 VITALS — BP 188/85 | HR 65 | Temp 99.0°F | Wt 110.0 lb

## 2014-12-12 DIAGNOSIS — R059 Cough, unspecified: Secondary | ICD-10-CM

## 2014-12-12 DIAGNOSIS — R0602 Shortness of breath: Secondary | ICD-10-CM

## 2014-12-12 DIAGNOSIS — R05 Cough: Secondary | ICD-10-CM

## 2014-12-12 DIAGNOSIS — R0789 Other chest pain: Secondary | ICD-10-CM

## 2014-12-12 LAB — COMPLETE METABOLIC PANEL WITH GFR
ALT: 11 U/L (ref 0–35)
AST: 20 U/L (ref 0–37)
Albumin: 3.6 g/dL (ref 3.5–5.2)
Alkaline Phosphatase: 93 U/L (ref 39–117)
BILIRUBIN TOTAL: 0.9 mg/dL (ref 0.2–1.2)
BUN: 9 mg/dL (ref 6–23)
CO2: 25 meq/L (ref 19–32)
CREATININE: 0.8 mg/dL (ref 0.50–1.10)
Calcium: 8.7 mg/dL (ref 8.4–10.5)
Chloride: 92 mEq/L — ABNORMAL LOW (ref 96–112)
GFR, EST AFRICAN AMERICAN: 78 mL/min
GFR, Est Non African American: 68 mL/min
Glucose, Bld: 74 mg/dL (ref 70–99)
Potassium: 3.5 mEq/L (ref 3.5–5.3)
Sodium: 130 mEq/L — ABNORMAL LOW (ref 135–145)
Total Protein: 6.5 g/dL (ref 6.0–8.3)

## 2014-12-12 LAB — CK TOTAL AND CKMB (NOT AT ARMC)
CK, MB: 2.4 ng/mL (ref 0.0–5.0)
Total CK: 90 U/L (ref 7–177)

## 2014-12-12 LAB — CBC WITH DIFFERENTIAL/PLATELET
Basophils Absolute: 0 10*3/uL (ref 0.0–0.1)
Basophils Relative: 0 % (ref 0–1)
EOS ABS: 0 10*3/uL (ref 0.0–0.7)
Eosinophils Relative: 0 % (ref 0–5)
HCT: 30.4 % — ABNORMAL LOW (ref 36.0–46.0)
HEMOGLOBIN: 10.7 g/dL — AB (ref 12.0–15.0)
LYMPHS PCT: 7 % — AB (ref 12–46)
Lymphs Abs: 0.7 10*3/uL (ref 0.7–4.0)
MCH: 31.8 pg (ref 26.0–34.0)
MCHC: 35.2 g/dL (ref 30.0–36.0)
MCV: 90.5 fL (ref 78.0–100.0)
MONO ABS: 1.2 10*3/uL — AB (ref 0.1–1.0)
MONOS PCT: 11 % (ref 3–12)
MPV: 9.9 fL (ref 8.6–12.4)
Neutro Abs: 8.7 10*3/uL — ABNORMAL HIGH (ref 1.7–7.7)
Neutrophils Relative %: 82 % — ABNORMAL HIGH (ref 43–77)
Platelets: 260 10*3/uL (ref 150–400)
RBC: 3.36 MIL/uL — ABNORMAL LOW (ref 3.87–5.11)
RDW: 13.1 % (ref 11.5–15.5)
WBC: 10.6 10*3/uL — AB (ref 4.0–10.5)

## 2014-12-12 LAB — TROPONIN I: Troponin I: 0.06 ng/mL — ABNORMAL HIGH (ref <0.06)

## 2014-12-12 LAB — TSH: TSH: 7.623 u[IU]/mL — ABNORMAL HIGH (ref 0.350–4.500)

## 2014-12-12 LAB — D-DIMER, QUANTITATIVE (NOT AT ARMC): D DIMER QUANT: 1.76 ug{FEU}/mL — AB (ref 0.00–0.48)

## 2014-12-12 LAB — BRAIN NATRIURETIC PEPTIDE: BRAIN NATRIURETIC PEPTIDE: 2838.2 pg/mL — AB (ref 0.0–100.0)

## 2014-12-12 MED ORDER — AZITHROMYCIN 250 MG PO TABS: ORAL_TABLET | ORAL | Status: DC

## 2014-12-12 MED ORDER — AZITHROMYCIN 250 MG PO TABS: ORAL_TABLET | ORAL | Status: AC

## 2014-12-12 NOTE — Progress Notes (Signed)
   Subjective:    Patient ID: Brooke Cunningham, female    DOB: 21-Nov-1928, 81 y.o.   MRN: [id number]  HPI  has not gotten any better since she was last seen by [facility] on 4/11 son/daughter reports that it is worse @ night mucus is clear pt stated that it hurts for her to take a deep breath. they did not p/u the tessalon pearls they opted to just do the delsym and mucinex which has not helped, No fever, chilld, ettc,  No sinus congestion, etc.  No GERD, has been swallowing ok. Cough is productive and wet. Not sleeping bc of the cough.  Today she feel worse.  Feel really weak today.  Occ having some chest pain inbetween coughing. Infectious started to feel uncomfortable in her chest in the substernal area of the lower chest after I had her take several deep breaths in the room.  Her daughter-in-law and son who are here with her today says she's been gradually getting weaker over the last few days. She denies any recent onset swelling.   We did review the chest x-ray results and discuss the T12 compression fracture that seems to have progressed in a severe. She has been complaining of pain in that area.  Review of Systems     Objective:   Physical Exam  Constitutional: She is oriented to person, place, and time. She appears well-developed and well-nourished.  HENT:  Head: Normocephalic and atraumatic.  Cardiovascular: Normal rate, regular rhythm and normal heart sounds.   Pulmonary/Chest: Effort normal and breath sounds normal.  Neurological: She is alert and oriented to person, place, and time.  Skin: Skin is warm and dry.  Psychiatric: She has a normal mood and affect. Her behavior is normal.          Assessment & Plan:  Cough/shortness of breath-chest x-ray 10 days ago was normal but she's been getting progressively worse and sputum is productive now that with white/clear-colored sputum. No upper respiratory type symptoms. We'll go ahead and place her on azithromycin. If she's not better  in 5 days will have repeat chest x-ray. Discussed with family that if she becomes more short of breath or deconditioning or week and she needs go to the emergency department immediately over the weekend.  Atypical chest pain-she has a history of congestive heart failure, atrial fibrillation and severe mitral regurgitation ; may be related to acute illness but since she has become more weak and complained of chest pain then recommend check a CBC for anemia, BNP for acute heart failure that she has not had any change in edema and a d-dimer to rule out PE. Her husband also died about 3 weeks ago so the stress may be causing some chest discomfort as well.  EKG today shows rate of 66 bpm, omelsartan S rhythm with left axis deviation. No acute ST-T wave changes. Inverted T waves in the inferior leads.

## 2014-12-18 ENCOUNTER — Telehealth: Payer: Self-pay | Admitting: Family Medicine

## 2014-12-18 NOTE — Telephone Encounter (Signed)
Patient's son called adv mother is in hospice and she is not doing very well. He canceled her appointment for this Thursday. Thanks

## 2014-12-18 NOTE — Telephone Encounter (Signed)
Called and spoke with her son TL. They did take her to the emergency department. She was diagnosed with a pre-significant pneumonia. They think she might have even had a mini stroke as she has difficulty swallowing. She continued to decline to the point that they recommended hospice. She is now been discharged to [facility] and is continuing to decline. Encouraged him to call if he needs us for anything. Appointment was already canceled.

## 2014-12-19 ENCOUNTER — Ambulatory Visit: Payer: Commercial Managed Care - HMO | Admitting: Sports Medicine

## 2014-12-22 DEATH — deceased

## 2014-12-23 ENCOUNTER — Telehealth: Payer: Self-pay | Admitting: Sports Medicine

## 2014-12-23 NOTE — Telephone Encounter (Signed)
Death certificate filled out today

## 2014-12-25 ENCOUNTER — Ambulatory Visit: Payer: Commercial Managed Care - HMO | Admitting: Family Medicine

## 2014-12-26 NOTE — Addendum Note (Signed)
Addended by: Chalmers CaterUTTLE, Ferman Basilio H on: 12/26/2014 09:02 AM   Modules accepted: Orders

## 2015-01-22 DEATH — deceased

## 2015-01-23 ENCOUNTER — Ambulatory Visit: Payer: Commercial Managed Care - HMO | Admitting: Family Medicine

## 2015-10-12 IMAGING — CR DG CHEST 2V
2 series · 2 of 2 positions shown · non-contrast
Comparison: 09/04/2012

CLINICAL DATA: Cough, congestion for a few days.  Pain with cough.

EXAM:
CHEST  2 VIEW

[chest pa]
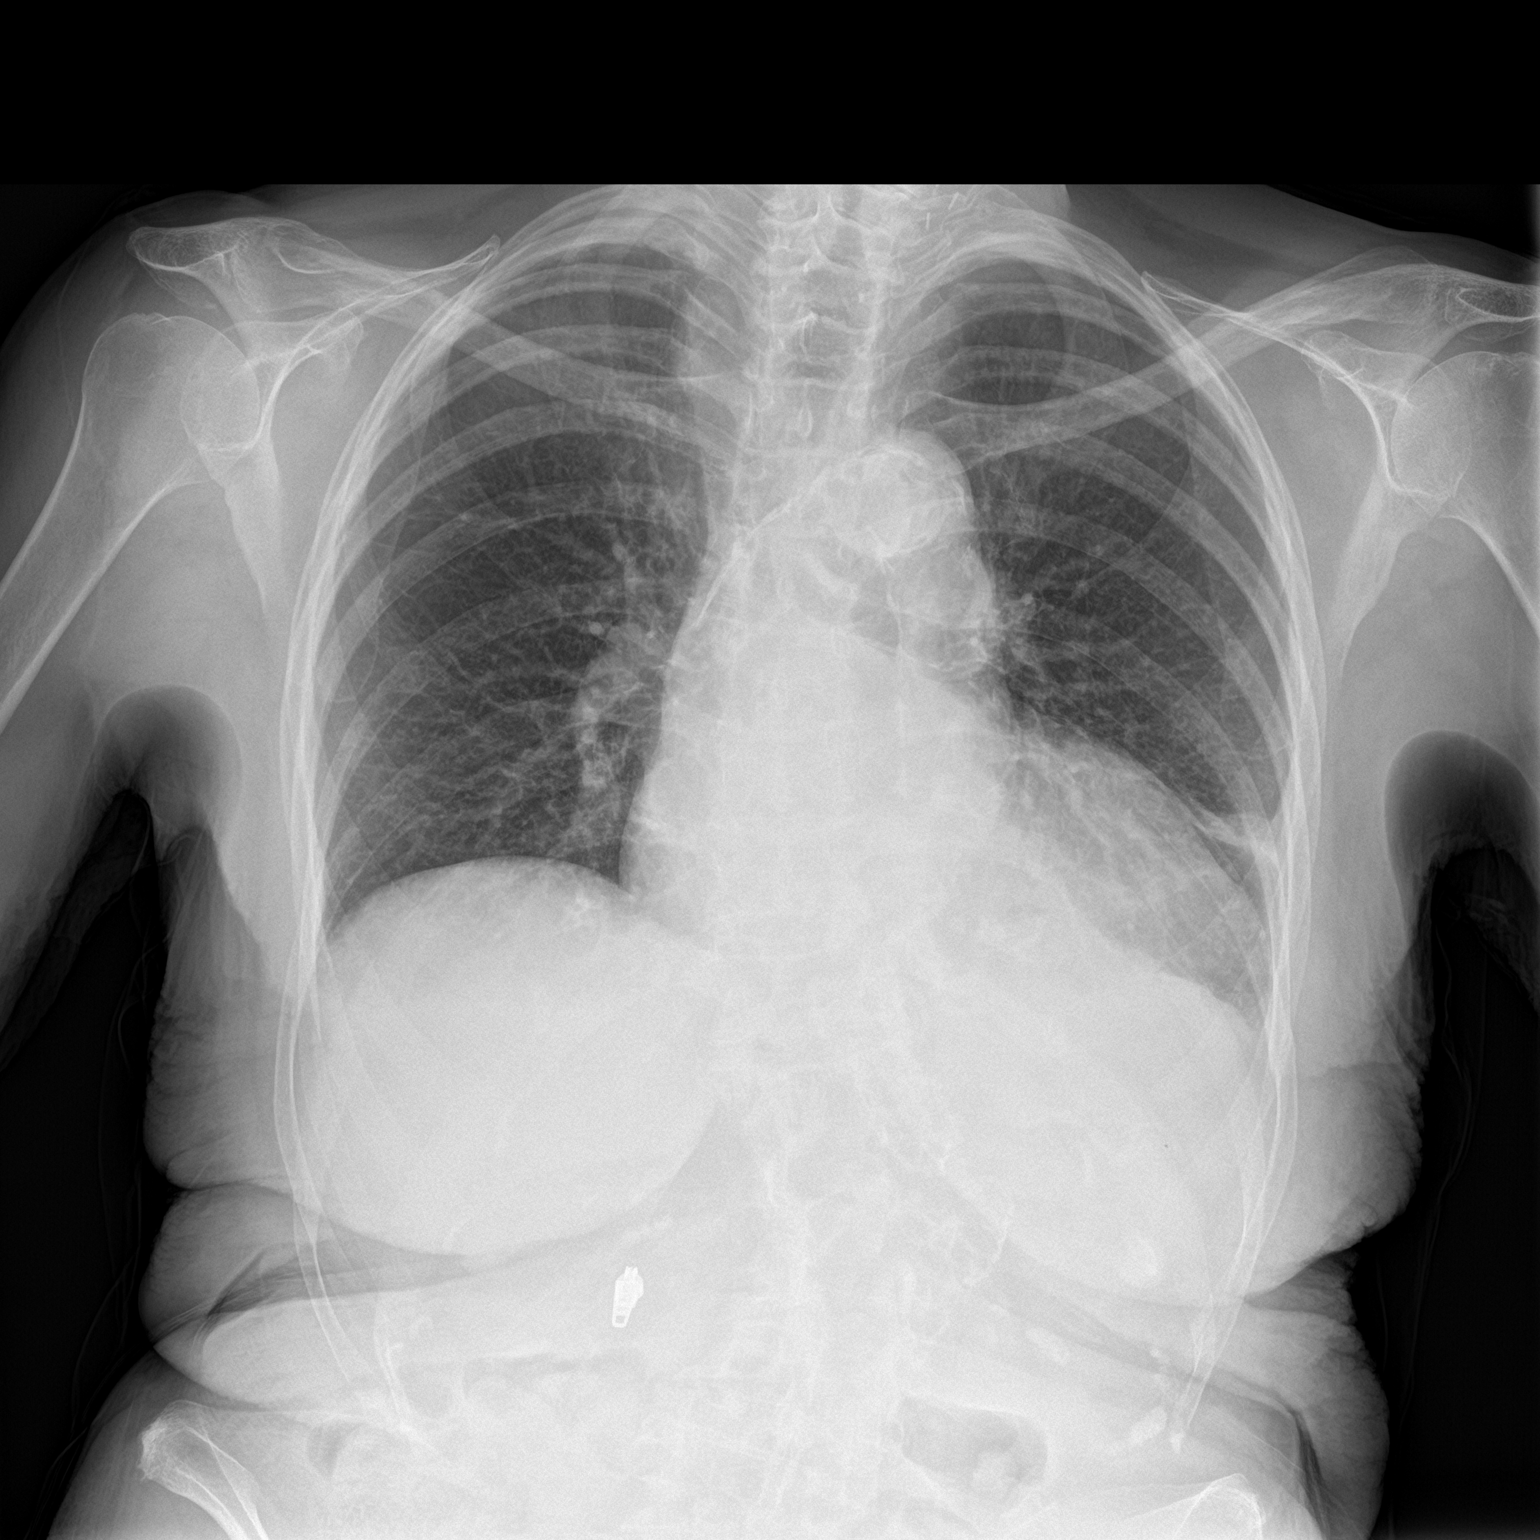

[chest lat]
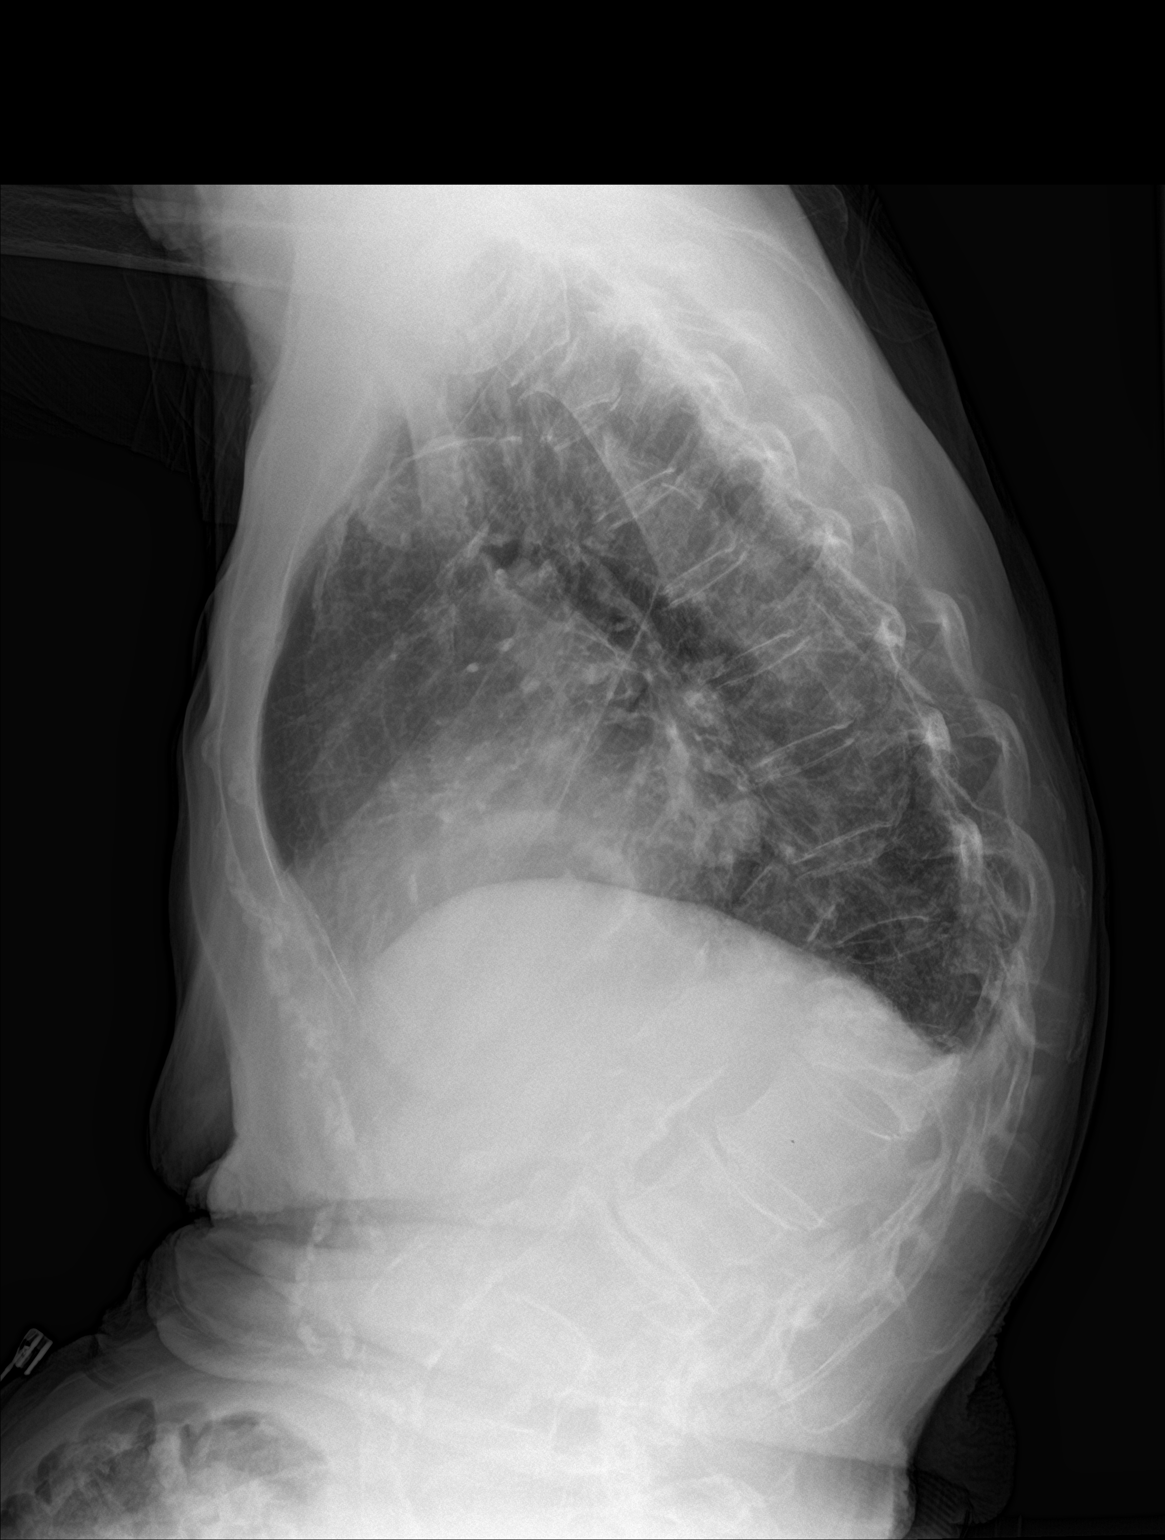

[2 of 2 positions shown; findings below may reference images not displayed]

FINDINGS: There is cardiomegaly. Diffuse aortic calcifications. No visible
aneurysm. Scarring or atelectasis in the left base. Right lung is
clear. No effusions. No acute bony abnormality. Progression of T12
compression fracture, now severe. Associated kyphosis.
IMPRESSION: Cardiomegaly.  Left base scarring or atelectasis.

Progression of lower thoracic compression fracture, now severe.
